# Patient Record
Sex: Male | Born: 1995 | Race: Black or African American | Hispanic: No | Marital: Single | Smoking: Never smoker
Health system: Southern US, Community
[De-identification: ages and names within clinical notes are randomized; demographics above are authoritative.]

## PROBLEM LIST (undated history)

## (undated) DIAGNOSIS — J45909 Unspecified asthma, uncomplicated: Secondary | ICD-10-CM

## (undated) DIAGNOSIS — L309 Dermatitis, unspecified: Secondary | ICD-10-CM

## (undated) HISTORY — DX: Dermatitis, unspecified: L30.9

## (undated) HISTORY — PX: GALLBLADDER SURGERY: SHX652

## (undated) HISTORY — PX: TYMPANOSTOMY TUBE PLACEMENT: SHX32

## (undated) HISTORY — DX: Unspecified asthma, uncomplicated: J45.909

---

## 2019-01-12 ENCOUNTER — Other Ambulatory Visit: Payer: Self-pay | Admitting: Gastroenterology

## 2019-01-12 DIAGNOSIS — R1013 Epigastric pain: Secondary | ICD-10-CM

## 2019-01-13 ENCOUNTER — Encounter: Payer: Self-pay | Admitting: Pediatrics

## 2019-01-13 ENCOUNTER — Ambulatory Visit: Payer: Federal, State, Local not specified - PPO | Admitting: Pediatrics

## 2019-01-13 VITALS — BP 120/72 | HR 84 | Temp 98.3°F | Resp 18 | Ht 70.5 in | Wt 172.6 lb

## 2019-01-13 DIAGNOSIS — T7800XD Anaphylactic reaction due to unspecified food, subsequent encounter: Secondary | ICD-10-CM

## 2019-01-13 DIAGNOSIS — J4520 Mild intermittent asthma, uncomplicated: Secondary | ICD-10-CM | POA: Diagnosis not present

## 2019-01-13 DIAGNOSIS — T7800XA Anaphylactic reaction due to unspecified food, initial encounter: Secondary | ICD-10-CM | POA: Insufficient documentation

## 2019-01-13 DIAGNOSIS — J301 Allergic rhinitis due to pollen: Secondary | ICD-10-CM

## 2019-01-13 DIAGNOSIS — K200 Eosinophilic esophagitis: Secondary | ICD-10-CM

## 2019-01-13 DIAGNOSIS — T781XXD Other adverse food reactions, not elsewhere classified, subsequent encounter: Secondary | ICD-10-CM

## 2019-01-13 DIAGNOSIS — J4521 Mild intermittent asthma with (acute) exacerbation: Secondary | ICD-10-CM | POA: Insufficient documentation

## 2019-01-13 DIAGNOSIS — T781XXA Other adverse food reactions, not elsewhere classified, initial encounter: Secondary | ICD-10-CM | POA: Insufficient documentation

## 2019-01-13 DIAGNOSIS — J339 Nasal polyp, unspecified: Secondary | ICD-10-CM

## 2019-01-13 MED ORDER — ALBUTEROL SULFATE HFA 108 (90 BASE) MCG/ACT IN AERS: INHALATION_SPRAY | RESPIRATORY_TRACT | 1 refills | Status: DC

## 2019-01-13 MED ORDER — FLUTICASONE PROPIONATE 50 MCG/ACT NA SUSP: NASAL | 5 refills | Status: DC

## 2019-01-13 NOTE — Patient Instructions (Addendum)
Environmental control of dust mite and mold Claritin 10 mg-take 1 tablet once a day if needed for runny nose for itchy eyes Fluticasone 1 spray per nostril twice a day for stuffy nose.  It will help nasal polyps Pro-air 2 puffs every 4 hours if needed for wheezing or coughing spells.  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  Budesonide syrup  was stopped in December 2019.  He has seen a gastroenterologist in the area and may need a follow-up biopsy because of abdominal discomfort.  He will continue pantoprazole 40 mg once a day and he will see if 5 days of prednisone helps the abdominal discomfort

## 2019-01-13 NOTE — Progress Notes (Signed)
[address] [place] [address] Dept: [phone]  New Patient Note  Patient ID: Kelly Patrick, male    DOB: 03-28-1996  Age: 23 y.o. MRN: [id number] Date of Office Visit: 01/13/2019 Referring provider: No referring provider defined for this encounter.    Chief Complaint: Asthma  HPI Kelly Patrick presents for allergy evaluation.  He has a history of eosinophilic esophagitis and for 2 years he was on budesonide syrup. In   December 2019 in [place] he had an esophageal biopsy and there were no eosinophils so budesonide syrup was stopped.  Recently he has had some abdominal discomfort and heartburn and was started on pantoprazole.  He has a history of nasal polyps.  Zyrtec gives him nosebleeds.  He has had hypertension from Sudafed.  He has had asthma since 23 years of age but his asthma is much improved.  He has a history of eczema when he was younger.  He has had allergic symptoms to exposure to dust, cigarette smoke, cats and weather changes.  In 2017 he had allergy skin test which showed some reactivity to shellfish, peanut, corn, potato, banana crab, almond and hazelnut.  He has been avoiding these foods He had been able to eat these foods in the past without any problems .  He has itching of his mouth when he eats tomato and fresh apples  Review of Systems  Constitutional: Negative.   HENT:       Seasonal allergic rhinitis for several years History of nasal polyps  Eyes: Negative.   Respiratory:       Asthma since 23 years of age.  No regular need for albuterol  Cardiovascular: Negative.   Gastrointestinal:       History of eosinophilic esophagitis.  Cholecystectomy  Genitourinary: Negative.   Musculoskeletal: Negative.   Skin:       History of eczema  Neurological: Negative.   Endo/Heme/Allergies:       No diabetes or thyroid disease.  Sickle cell trait  Psychiatric/Behavioral: Negative.     Outpatient Encounter Medications as of 01/13/2019  Medication Sig  .  EPINEPHrine 0.3 mg/0.3 mL IJ SOAJ injection Inject 0.3 mg into the muscle once.  . pantoprazole (PROTONIX) 40 MG tablet Take 40 mg by mouth daily.  Marland Kitchen albuterol (PROAIR HFA) 108 (90 Base) MCG/ACT inhaler 2 puffs every 4 hours if needed for wheezing or coughing spells  . fluticasone (FLONASE) 50 MCG/ACT nasal spray 1 spray per nostril twice a day for stuffy nose  . levalbuterol (XOPENEX HFA) 45 MCG/ACT inhaler Inhale 2 puffs into the lungs every 4 (four) hours as needed.   No facility-administered encounter medications on file as of 01/13/2019.      Drug Allergies:  Allergies  Allergen Reactions  . Sudafed  [Pseudoephedrine Hcl] Other (See Comments)    High blood pressure     Family History: Edgard's family history includes Allergic rhinitis in his mother..  Family history is positive for asthma and sinus problems.  Family history is negative for angioedema, eczema, chronic urticaria, food allergies, lupus, chronic bronchitis or emphysema.  Social and environmental.  There are no pets in the home.  He is not exposed to cigarette smoking.  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  He has not smoked cigarettes in the past.  Physical Exam: BP 120/72   Pulse 84   Temp 98.3 F (36.8 C) (Oral)   Resp 18   Ht 5' 10.5" (1.791 m)   Wt 172  lb 9.6 oz (78.3 kg)   SpO2 96%   BMI 24.42 kg/m    Physical Exam Vitals signs reviewed.  Constitutional:      Appearance: Normal appearance. He is normal weight.  HENT:     Head:     Comments: Eyes normal Ears normal.  Nose showed mild swelling of the nasal turbinates with some polypoid changes Pharynx normal. Neck:     Musculoskeletal: Neck supple.     Comments: No thyromegaly Cardiovascular:     Comments: S1-S2 normal no murmurs Pulmonary:     Comments: Clear to percussion and auscultation Abdominal:     Palpations: Abdomen is soft.     Tenderness: There is no abdominal tenderness.     Comments: No hepatosplenomegaly    Lymphadenopathy:     Cervical: No cervical adenopathy.  Skin:    Comments: Clear  Neurological:     General: No focal deficit present.     Mental Status: He is alert and oriented to person, place, and time.  Psychiatric:        Mood and Affect: Mood normal.        Behavior: Behavior normal.        Thought Content: Thought content normal.        Judgment: Judgment normal.     Diagnostics: FVC 4.60 L FEV1 3.78 L.  Predicted FVC 4.88 L predicted FEV1 4.15 L.  After albuterol 2 puffs DC 4.66 L FEV1 4.11 L-the spirometry is in the normal range and there was no significant improvement after albuterol  Allergy skin test were extremely positive to grass pollen, weeds, tree pollens, molds.  Slight reactivity noted to cat, dog, cockroach and dust mite.  He had a 2 x 2 wheal to peanut.  He had a minimal reactivity to almond and slight reactivity to hazelnut.  Skin test to shellfish, corn, banana, tomato and white potatoes were negative   Assessment  Assessment and Plan: 1. Mild intermittent asthma without complication   2. Anaphylactic shock due to food, subsequent encounter   3. EE (eosinophilic esophagitis)   4. Seasonal allergic rhinitis due to pollen   5. Nasal polyposis   6. Pollen-food allergy, subsequent encounter     Meds ordered this encounter  Medications  . albuterol (PROAIR HFA) 108 (90 Base) MCG/ACT inhaler    Sig: 2 puffs every 4 hours if needed for wheezing or coughing spells    Dispense:  1 Inhaler    Refill:  1  . fluticasone (FLONASE) 50 MCG/ACT nasal spray    Sig: 1 spray per nostril twice a day for stuffy nose    Dispense:  18.2 g    Refill:  5    Patient Instructions  Environmental control of dust mite and mold Claritin 10 mg-take 1 tablet once a day if needed for runny nose for itchy eyes Fluticasone 1 spray per nostril twice a day for stuffy nose.  It will help nasal polyps Pro-air 2 puffs every 4 hours if needed for wheezing or coughing spells.  You may  use Pro-air 2 puffs 5 to 15 minutes before exercise Add prednisone 20 mg twice a day for 3 days, 20 mg on the fourth day, 10 mg in the fifth day to bring your allergic symptoms under control Continue on your other medications Call us if you are not doing well on this treatment plan  I will call you with the results of your blood work for allergies to peanut, tree nuts , and  shellfish Avoid them for now and  be very careful with tomato and apples which are related to your oral allergy syndrome I gave you a list of foods associated with oral allergy syndrome from birch and  grass pollen  He has a history of eosinophilic esophagitis.  Budesonide syrup  was stopped in December 2019.  He has seen a gastroenterologist in the area and may need a follow-up biopsy because of abdominal discomfort.  He will continue pantoprazole 40 mg once a day and he will see if 5 days of prednisone helps the abdominal discomfort   Return in about 4 weeks (around 02/10/2019).   Thank you for the opportunity to care for this patient.  Please do not hesitate to contact me with questions.  Tonette Bihari, M.D.  [facility] [address] [place], [place] [address] [phone]

## 2019-01-14 ENCOUNTER — Ambulatory Visit
Admission: RE | Admit: 2019-01-14 | Discharge: 2019-01-14 | Disposition: A | Discharge status: Home / Self Care | Payer: Federal, State, Local not specified - PPO | Source: Ambulatory Visit | Attending: Gastroenterology | Admitting: Gastroenterology

## 2019-01-14 ENCOUNTER — Other Ambulatory Visit: Payer: Self-pay | Admitting: Gastroenterology

## 2019-01-14 DIAGNOSIS — R1013 Epigastric pain: Secondary | ICD-10-CM

## 2019-01-14 MED ORDER — IOPAMIDOL (ISOVUE-300) INJECTION 61%: 100.0000 mL | Freq: Once | INTRAVENOUS | Status: DC | PRN

## 2019-01-15 ENCOUNTER — Telehealth: Payer: Self-pay | Admitting: *Deleted

## 2019-01-15 DIAGNOSIS — T7800XD Anaphylactic reaction due to unspecified food, subsequent encounter: Secondary | ICD-10-CM

## 2019-01-15 NOTE — Telephone Encounter (Signed)
ADDED lab orders and informed mom of me doing so

## 2019-01-15 NOTE — Telephone Encounter (Signed)
Go ahead and add  those foods

## 2019-01-15 NOTE — Telephone Encounter (Signed)
I informed mom yes we did test for white potato and it was negative. But we did not test to peas, strawberries, or oranges. Can we add those to his lab orders?

## 2019-01-15 NOTE — Telephone Encounter (Signed)
Pt mother called stating that Kelly Patrick had an allergy test done on March 3rd. She has the results from the test but said that there are several things that Kelly Patrick has previously tested positive to that are not included on the list. Potatoes, Oranges, Strawberry's, and peas. Mom is wondering if we tested for those foods.  Kelly Patrick is supposed to get blood work done and mom is wanting to know if we can please add those four foods to the lab orders.  Requesting a return call.

## 2019-01-23 LAB — IGE PEANUT COMPONENT PROFILE
F352-IgE Ara h 8: 8.89 kU/L — AB
F422-IgE Ara h 1: <0.1 kU/L
F423-IgE Ara h 2: <0.1 kU/L
F424-IgE Ara h 3: <0.1 kU/L
F427-IgE Ara h 9: <0.1 kU/L
F447-IgE Ara h 6: <0.1 kU/L

## 2019-01-23 LAB — ALLERGEN PROFILE, SHELLFISH
Clam IgE: 0.78 kU/L — AB
F023-IgE Crab: 0.76 kU/L — AB
F080-IgE Lobster: 0.46 kU/L — AB
F290-IgE Oyster: <0.1 kU/L
Scallop IgE: 0.42 kU/L — AB
Shrimp IgE: 1.17 kU/L — AB

## 2019-01-23 LAB — ALLERGEN PISTACHIO F203: F203-IgE Pistachio Nut: 0.93 kU/L — AB

## 2019-01-23 LAB — ALLERGENS(7)
Brazil Nut IgE: <0.1 kU/L
F020-IgE Almond: 1.28 kU/L — AB
F202-IgE Cashew Nut: <0.1 kU/L
Hazelnut (Filbert) IgE: 32.3 kU/L — AB
Peanut IgE: 3.99 kU/L — AB
Pecan Nut IgE: <0.1 kU/L
Walnut IgE: 0.3 kU/L — AB

## 2019-01-23 LAB — ALLERGEN COCONUT IGE: Allergen Coconut IgE: 0.38 kU/L — AB

## 2019-01-23 LAB — ALLERGEN, STRAWBERRY, F44: Allergen Strawberry IgE: 1.08 kU/L — AB

## 2019-01-23 LAB — ALLERGEN, ORANGE F33: Orange: 0.44 kU/L — AB

## 2019-01-23 LAB — ALLERGEN PEA F12: Allergen Green Pea IgE: 0.17 kU/L — AB

## 2019-01-28 ENCOUNTER — Telehealth: Payer: Self-pay | Admitting: Pediatrics

## 2019-01-28 ENCOUNTER — Other Ambulatory Visit: Payer: Self-pay

## 2019-01-28 MED ORDER — ALBUTEROL SULFATE (2.5 MG/3ML) 0.083% IN NEBU: 2.5000 mg | INHALATION_SOLUTION | Freq: Four times a day (QID) | RESPIRATORY_TRACT | 5 refills | Status: DC | PRN

## 2019-01-28 NOTE — Telephone Encounter (Signed)
Nebulizer solution sent to [facility].

## 2019-01-28 NOTE — Telephone Encounter (Signed)
Pt mom called and needs to have albuterol for neb. [facility] [place]. [phone].

## 2019-02-02 ENCOUNTER — Telehealth: Payer: Self-pay

## 2019-02-02 NOTE — Telephone Encounter (Signed)
Patient mother called upset about her son results have not been release to them and wanted to know why no one has called about lab results. Advise patient's mother that Dr. Beaulah Dinning has been out of the office and has not reviewed his labs. Patient doesn't have his mother or father on the [place] and only have a Dr. Vear Clock. Patient # is [id number]

## 2019-02-06 ENCOUNTER — Telehealth: Payer: Self-pay

## 2019-02-06 NOTE — Telephone Encounter (Signed)
Please call mom back.  I reviewed results - peanuts, tree nuts, coconut, shellfish were all positive. I recommend strict avoidance of these foods.  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 Usually fresh tree fruits such as apples can cause this. This is caused by cross reactivity of pollen with fresh fruits and vegetables, and nuts. Symptoms are usually localized in the form of itching and burning in mouth and throat. Very rarely it can progress to more severe symptoms. Eating foods in cooked or processed forms usually minimizes symptoms. I recommended avoidance of eating the problem foods, especially during the peak season(s).   Did the prednisone help with his GI symptoms? It looks like he has history of eosinophilic esophagitis which is why he may have the GI symptoms he complained about at the last visit.   Can you clarify on what she means by he gets sick everytime he eats? Thank you.

## 2019-02-06 NOTE — Telephone Encounter (Signed)
Please see phone encounter note from 02/06/2019.

## 2019-02-06 NOTE — Telephone Encounter (Signed)
pts mom calling to get lab results Kelly Patrick talked with mom Kelly Patrick after I got verbal consent to talk to mom from pt. Dr Selena Batten has agreed to look at labs and we will call pts mom back within an hour. Please advise

## 2019-02-06 NOTE — Telephone Encounter (Signed)
Dr. Selena Batten the patient's mother called very upset that she has not yet to receive the results of the patient's lab results from Dr. Beaulah Dinning. Advised that Dr. Beaulah Dinning has been out. Consulted with Dr. Delorse Lek and she recommends to have the provider in [facility] to review the labs so that they can be relayed to the patient's family. Will you please review the labs and send your results to the [facility] pool so that they are able to contact the patient. Thank You.

## 2019-02-06 NOTE — Telephone Encounter (Addendum)
SPENT  25 minutes on phone with both parents explaining everything to them. Dad is a Engineer, civil (consulting) and is very concerned about what pt can eat. I gave pts mom my email address here at work to send me results of testing he had done at [facility] march 17 th of this year. They are all confused on what exactly he can eat. I told them I would forward questions and testing to dr Beaulah Dinning on Monday when he returns and we will do our best to figure out what pt can eat. Pt did stat he is just having some cramping when he eats foods he is allergic too.   I informed them I would call back Monday and give them more information once dr B is back in clinic and reviews everything.  Moms # [phone]

## 2019-02-10 ENCOUNTER — Other Ambulatory Visit: Payer: Self-pay

## 2019-02-10 ENCOUNTER — Ambulatory Visit (INDEPENDENT_AMBULATORY_CARE_PROVIDER_SITE_OTHER): Payer: Federal, State, Local not specified - PPO | Admitting: Allergy

## 2019-02-10 ENCOUNTER — Encounter: Payer: Self-pay | Admitting: Allergy

## 2019-02-10 DIAGNOSIS — J301 Allergic rhinitis due to pollen: Secondary | ICD-10-CM

## 2019-02-10 DIAGNOSIS — J4520 Mild intermittent asthma, uncomplicated: Secondary | ICD-10-CM

## 2019-02-10 DIAGNOSIS — T781XXD Other adverse food reactions, not elsewhere classified, subsequent encounter: Secondary | ICD-10-CM | POA: Diagnosis not present

## 2019-02-10 DIAGNOSIS — K200 Eosinophilic esophagitis: Secondary | ICD-10-CM

## 2019-02-10 NOTE — Progress Notes (Signed)
RE: Salaam Ceresa MRN: [id number] DOB: 04-28-1996 Date of Telemedicine Visit: 02/10/2019  Referring provider: No ref. provider found Primary care provider: Patient, No Pcp Per  Chief Complaint: Food Intolerance   Telemedicine Follow Up Visit via WebEx: I connected with Benjamyn Deorio for a follow up on 02/12/19 by WebEx and verified that I am speaking with the correct person using two identifiers.   I discussed the limitations, risks, security and privacy concerns of performing an evaluation and management service by telemedicine and the availability of in person appointments. I also discussed with the patient that there may be a patient responsible charge related to this service. The patient expressed understanding and agreed to proceed.  Patient is at home accompanied by mother and father who is at work. Both parents provided/contributed to the history.  Provider is at the office.  Visit start time: 3:31PM Visit end time: 4:05PM Insurance consent/check in by: Rosalita Levan Medical consent and medical assistant/nurse: Rosana Fret  History of Present Illness: He is a 23 y.o. male, who is being followed for asthma, allergic rhinitis, nasal polyp, eoe, adverse food reactions. His previous allergy office visit was on 01/13/2019 with Dr. Beaulah Dinning. Today concerned about skin testing and bloodwork results for the foods and looking for direction.  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 Patient planning on coming back to [facility] in June.   Patient was diagnosed with EoE a few years ago and was on Budesonide BID with good benefit. He had EGD in Dec 2019 which showed no eosinophils. Budesonide was stopped and afterwards noticing abdominal pain. Denies any nausea, vomiting or trouble swallowing liquids/foods.  They want to know what he can have to eat as he had various skin testing and bloodwork and he is getting mixed instructions.   He is being followed by ENT and GI as  well.    Currently avoiding rice, peanut, soy, beef, shrimp, lobster, orange, corn, potato, tomatoes, tree nuts, strawberries.  In 2017 he had allergy skin test which showed some reactivity to shellfish, peanut, corn, potato, banana crab, almond and hazelnut.  He has itching of his mouth when he eats tomato and fresh apples.  Patient also has allergic rhinitis, asthma and ? nasal polyps.      EGD Dec 2019 pathology: 1. The sections show portions of gastric fundic and antral mucosa with irregularly shaped pits and glands. The surface epithelium shows inflammatory changes with decreased mucin production and enlarged nuclei. The lamina propria shows no significant inflammation, but has mild edema and smooth muscle proliferation. No bacteria resembling H. pylori are seen. There is no evidence of goblet cell metaplasia, ulceration, or malignancy.  2. This is mildly congested squamous epithelium. The specimen contains stratified squamous epithelium. The epithelium is free of inflammation. There are no eosinophilic infiltrates. The basal cell layer is not thickened. There is no evidence of ulcer or erosion.  Assessment and Plan: Carrson is a 23 y.o. male with: EE (eosinophilic esophagitis) Patient diagnosed with eosinophilic esophagitis a few years ago and was well controlled with budesonide BID. Last December 2019 he had repeat EGD which was clear and stopped budesonide. Since then he has been having abdominal pains but denies any other GI symptoms. Parents very concerned about what foods he can eat as he has been getting different instructions. He is being followed by GI and ENT as well. He had multiple sets of food/immunocap testing. Please see chart and notes for details.  Had  a long discussion with mother, father and patient today via Webex and educated them about eoe - diagnosis and management.   I did email them a pdf handout on EoE which gives information on EoE management and  diagnosis.  Keep a food diary and diary of symptoms to try to identify the foods that are triggering your symptoms.  For now, I want you to start with a strict dairy elimination diet. You should do this for at least 8 weeks. Ideally, you should get a repeat EGD after each food elimination diet is done as sometimes clinical symptoms do not correlate with pathology.  FOODS TO AVOID: - All cows, goats and sheeps milk (whole, low-fat, skim, butter milk, evaporated, condensed, powdered, formula milk, hot cocoa) - Milk products (all kind of cheeses, yogurt, butter, margarine, ice creams, milkshakes, custard, creme caramel, rice pudding) - Foods that may contain milk (biscuits, cookies, donuts, muffins, pancakes, waffles, crackers, cream desserts, sweets, candies, chocolate with milk, walnut cream, sausages, ham, pork sausage) FOOD REINTRODUCTION: - May reintroduce beef, peas, processed strawberry, processed orange. (Processed means not fresh forms). One at a time over 1 week period.  - Please continue strict avoidance of peanuts, tree nuts, shellfish, soy, potato, tomato, rice and corn.  - I do not want to make too many changes to your diet as you may have more than 1 trigger foods.  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  Discussed that his food triggered oral and throat symptoms are likely caused by oral food allergy syndrome (OFAS). This is caused by cross reactivity of pollen with fresh fruits and vegetables, and nuts. Symptoms are usually localized in the form of itching and burning in mouth and throat. Very rarely it can progress to more severe symptoms. Eating foods in cooked or processed forms usually minimizes symptoms. I recommended avoidance of eating the problem foods, especially during the peak season(s). Sometimes, OFAS can induce severe throat swelling or even a systemic reaction; with such instance, I advised them to report to a local ER. A list of  common pollens and food cross-reactivities was provided to the patient.   Seasonal allergic rhinitis due to pollen Past history - March 2020 skin testing was positive to grass, weed, tree, mold, cat, dog, cockroach, dust mite. 2020 Immunocap was positive additionally to ragweed. Interim history   Continue environmental control measures.  May use over the counter antihistamines such as Zyrtec (cetirizine), Claritin (loratadine), Allegra (fexofenadine), or Xyzal (levocetirizine) daily as needed.  May use Flonase 1-2 sprays daily for nasal congestion.  Mild intermittent asthma without complication  Daily controller medication(s): NONE  Prior to physical activity: May use albuterol rescue inhaler 2 puffs 5 to 15 minutes prior to strenuous physical activities.  Rescue medications: May use albuterol rescue inhaler 2 puffs or nebulizer every 4 to 6 hours as needed for shortness of breath, chest tightness, coughing, and wheezing. Monitor frequency of use.   Return in about 2 months (around 04/12/2019).  Diagnostics: None.  Medication List:  Current Outpatient Medications  Medication Sig Dispense Refill   albuterol (PROAIR HFA) 108 (90 Base) MCG/ACT inhaler 2 puffs every 4 hours if needed for wheezing or coughing spells 1 Inhaler 1   albuterol (PROVENTIL) (2.5 MG/3ML) 0.083% nebulizer solution Take 3 mLs (2.5 mg total) by nebulization every 6 (six) hours as needed for wheezing or shortness of breath. 75 mL 5   EPINEPHrine 0.3 mg/0.3 mL IJ SOAJ injection Inject 0.3 mg into the muscle  once.     fluticasone (FLONASE) 50 MCG/ACT nasal spray 1 spray per nostril twice a day for stuffy nose 18.2 g 5   pantoprazole (PROTONIX) 40 MG tablet Take 40 mg by mouth daily.     No current facility-administered medications for this visit.    Allergies: Allergies  Allergen Reactions   Sudafed  [Pseudoephedrine Hcl] Other (See Comments)    High blood pressure    I reviewed his past medical  history, social history, family history, and environmental history and no significant changes have been reported from previous visit on 01/13/2019.  Review of Systems  Constitutional: Negative for appetite change, chills, fever and unexpected weight change.  HENT: Negative for congestion and rhinorrhea.   Eyes: Negative for itching.  Respiratory: Negative for cough, chest tightness, shortness of breath and wheezing.   Gastrointestinal: Positive for abdominal pain. Negative for nausea and vomiting.  Skin: Negative for rash.  Allergic/Immunologic: Positive for environmental allergies and food allergies.  Neurological: Negative for headaches.   Objective: Physical Exam Not obtained as encounter was done via WebEx.  Previous notes and tests were reviewed.  I discussed the assessment and treatment plan with the patient. The patient was provided an opportunity to ask questions and all were answered. The patient agreed with the plan and demonstrated an understanding of the instructions. After visit summary/patient instructions available via e-mail.   The patient was advised to call back or seek an in-person evaluation if the symptoms worsen or if the condition fails to improve as anticipated.  I provided 34 minutes of video-face-to-face time during this encounter.  It was my pleasure to participate in Pajaros Tokarski's care today. Please feel free to contact me with any questions or concerns.   Sincerely,  Wyline Mood, DO Allergy & Immunology  [facility] office: [phone] [facility] office: [phone]

## 2019-02-11 ENCOUNTER — Encounter: Payer: Self-pay | Admitting: Allergy

## 2019-02-11 NOTE — Patient Instructions (Addendum)
Please read the article attached starting with page 9 and page 10.  As we discussed it's important to keep a food diary and diary of symptoms to try to identify the foods that are triggering your symptoms.  For now, I want you to start with a strict dairy elimination diet. You should do this for at least 8 weeks. Ideally, you should get a repeat EGD after each food elimination diet is done as sometimes clinical symptoms do not correlate with pathology.   FOODS TO AVOID: - All cows, goats and sheeps milk (whole, low-fat, skim, butter milk, evaporated, condensed, powdered, formula milk, hot cocoa) - Milk products (all kind of cheeses, yogurt, butter, margarine, ice creams, milkshakes, custard, creme caramel, rice pudding) - Foods that may contain milk (biscuits, cookies, donuts, muffins, pancakes, waffles, crackers, cream desserts, sweets, candies, chocolate with milk, walnut cream, sausages, ham, pork sausage)  FOOD REINTRODUCTION: May reintroduce beef, peas, processed strawberry, processed orange. (Processed means not fresh forms). One at a time over 1 week period.   Please continue strict avoidance of peanuts, tree nuts, shellfish, soy, potato, tomato, rice and corn.  I do not want to make too many changes to your diet as you may have more than 1 trigger foods.  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 Discussed that his food triggered oral and throat symptoms are likely caused by oral food allergy syndrome (OFAS). This is caused by cross reactivity of pollen with fresh fruits and vegetables, and nuts. Symptoms are usually localized in the form of itching and burning in mouth and throat. Very rarely it can progress to more severe symptoms. Eating foods in cooked or processed forms usually minimizes symptoms. I recommended avoidance of eating the problem foods, especially during the peak  season(s). Sometimes, OFAS can induce severe throat swelling or even a systemic reaction; with such instance, I advised them to report to a local ER.    You are allergic to birch tree pollen, ragweed, mugwort, orchard and timothy and may notice issues with the below foods (see chart)  Environmental allergies: Past history - March 2020 skin testing was positive to grass, weed, tree, mold, cat, dog, cockroach, dust mite. 2020 Immunocap was positive additionally to ragweed. Interim history   Continue environmental control measures.  May use over the counter antihistamines such as Zyrtec (cetirizine), Claritin (loratadine), Allegra (fexofenadine), or Xyzal (levocetirizine) daily as needed.  May use Flonase 1-2 sprays daily for nasal congestion.  Asthma: . Daily controller medication(s): NONE . Prior to physical activity: May use albuterol rescue inhaler 2 puffs 5 to 15 minutes prior to strenuous physical activities. Marland Kitchen Rescue medications: May use albuterol rescue inhaler 2 puffs or nebulizer every 4 to 6 hours as needed for shortness of breath, chest tightness, coughing, and wheezing. Monitor frequency of use.      Sincerely,  Wyline Mood, DO Allergy & Immunology  [facility] office: [phone] [facility] office: [phone]

## 2019-02-12 NOTE — Assessment & Plan Note (Addendum)
Patient diagnosed with eosinophilic esophagitis a few years ago and was well controlled with budesonide BID. Last December 2019 he had repeat EGD which was clear and stopped budesonide. Since then he has been having abdominal pains but denies any other GI symptoms. Parents very concerned about what foods he can eat as he has been getting different instructions. He is being followed by GI and ENT as well. He had multiple sets of food/immunocap testing. Please see chart and notes for details.  Had a long discussion with mother, father and patient today via Web[employer] and educated them about eoe - diagnosis and management.   I did email them a pdf handout on EoE which gives information on EoE management and diagnosis.  Keep a food diary and diary of symptoms to try to identify the foods that are triggering your symptoms.  For now, I want you to start with a strict dairy elimination diet. You should do this for at least 8 weeks. Ideally, you should get a repeat EGD after each food elimination diet is done as sometimes clinical symptoms do not correlate with pathology.  FOODS TO AVOID: - All cows, goats and sheeps milk (whole, low-fat, skim, butter milk, evaporated, condensed, powdered, formula milk, hot cocoa) - Milk products (all kind of cheeses, yogurt, butter, margarine, ice creams, milkshakes, custard, creme caramel, rice pudding) - Foods that may contain milk (biscuits, cookies, donuts, muffins, pancakes, waffles, crackers, cream desserts, sweets, candies, chocolate with milk, walnut cream, sausages, ham, pork sausage) FOOD REINTRODUCTION: - May reintroduce beef, peas, processed strawberry, processed orange. (Processed means not fresh forms). One at a time over 1 week period.  - Please continue strict avoidance of peanuts, tree nuts, shellfish, soy, potato, tomato, rice and corn.  - I do not want to make too many changes to your diet as you may have more than 1 trigger foods.

## 2019-02-12 NOTE — Assessment & Plan Note (Signed)
Past history - March 2020 skin testing was positive to grass, weed, tree, mold, cat, dog, cockroach, dust mite. 2020 Immunocap was positive additionally to ragweed. Interim history   Continue environmental control measures.  May use over the counter antihistamines such as Zyrtec (cetirizine), Claritin (loratadine), Allegra (fexofenadine), or Xyzal (levocetirizine) daily as needed.  May use Flonase 1-2 sprays daily for nasal congestion.

## 2019-02-12 NOTE — Assessment & Plan Note (Signed)
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  Discussed that his food triggered oral and throat symptoms are likely caused by oral food allergy syndrome (OFAS). This is caused by cross reactivity of pollen with fresh fruits and vegetables, and nuts. Symptoms are usually localized in the form of itching and burning in mouth and throat. Very rarely it can progress to more severe symptoms. Eating foods in cooked or processed forms usually minimizes symptoms. I recommended avoidance of eating the problem foods, especially during the peak season(s). Sometimes, OFAS can induce severe throat swelling or even a systemic reaction; with such instance, I advised them to report to a local ER. A list of common pollens and food cross-reactivities was provided to the patient.

## 2019-02-12 NOTE — Assessment & Plan Note (Signed)
.   Daily controller medication(s): NONE . Prior to physical activity: May use albuterol rescue inhaler 2 puffs 5 to 15 minutes prior to strenuous physical activities. Marland Kitchen Rescue medications: May use albuterol rescue inhaler 2 puffs or nebulizer every 4 to 6 hours as needed for shortness of breath, chest tightness, coughing, and wheezing. Monitor frequency of use.

## 2019-04-02 ENCOUNTER — Telehealth: Payer: Self-pay | Admitting: *Deleted

## 2019-04-02 NOTE — Telephone Encounter (Signed)
Patients mother calling wanting to ask Dr. Beaulah Dinning' option on if he can get a hypoallergenic dog?

## 2019-04-07 NOTE — Telephone Encounter (Signed)
I discussed getting a dog for Keeyon with his mother.  The family wants to get a small poodle.  He only had slight reactivity to dog on  intradermal testing .  He may get a poodle but keep it out of his bedroom

## 2019-06-21 IMAGING — CT CT ABD-PELV W/O CM
2 of 4 series · 13 of 46 positions shown, 15 images · non-contrast
Comparison: None.

CLINICAL DATA: Epigastric abdominal pain, status post
cholecystectomy, eosinophilic esophagitis.

EXAM:
CT ABDOMEN AND PELVIS WITHOUT CONTRAST
TECHNIQUE: Multidetector CT imaging of the abdomen and pelvis was performed
following the standard protocol without IV contrast.

[Series 2: routine abdomen pelvis without 5.00 br40 s3 ax · axial · non-contrast · 0.51mm/px · z∈[+1385,+1764]mm · 10 of 92 slices shown, 12 images]
[im 8/92  soft-tissue]
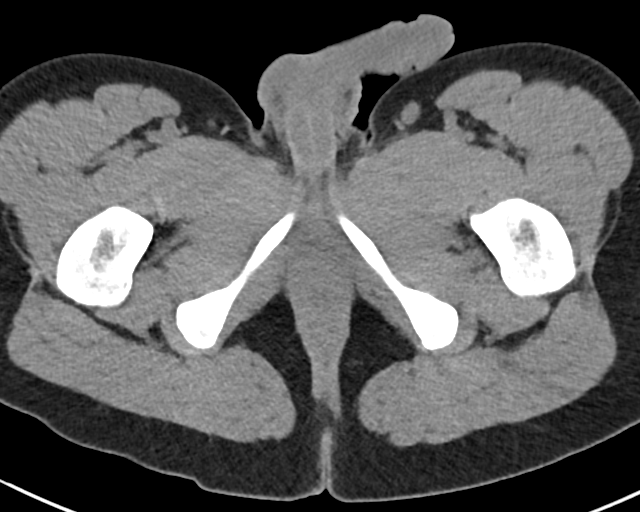
[im 8/92  bone]
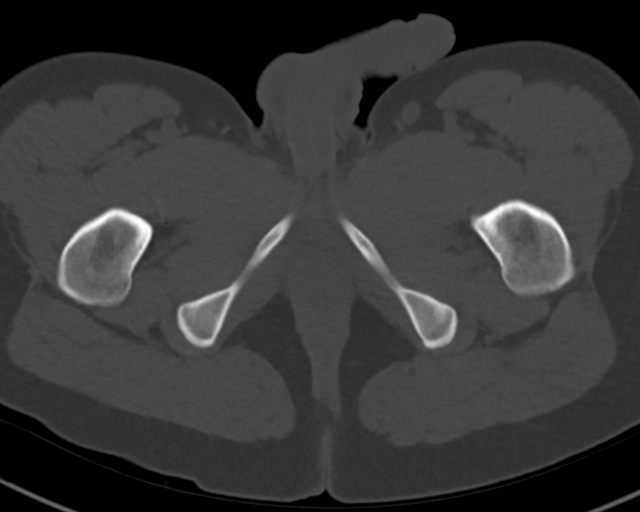
[im 16/92  soft-tissue]
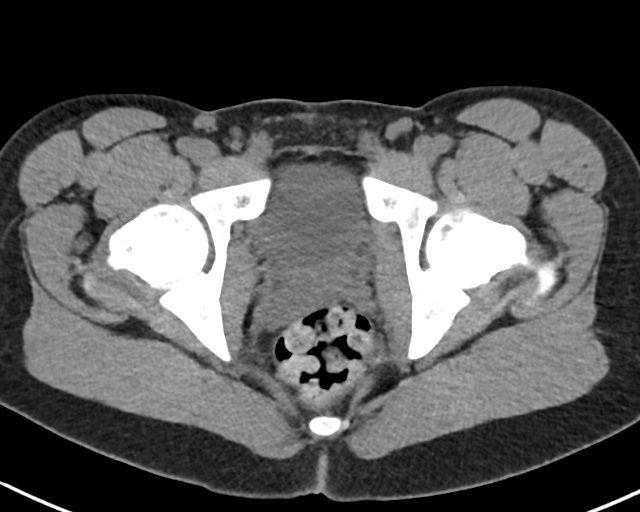
[im 24/92  soft-tissue]
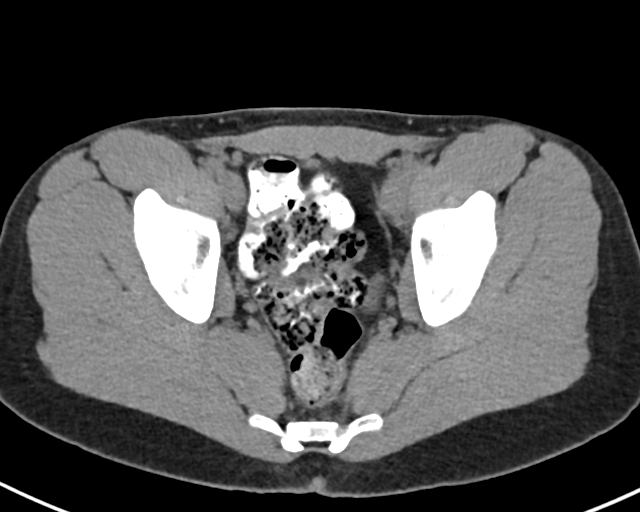
[im 32/92  soft-tissue]
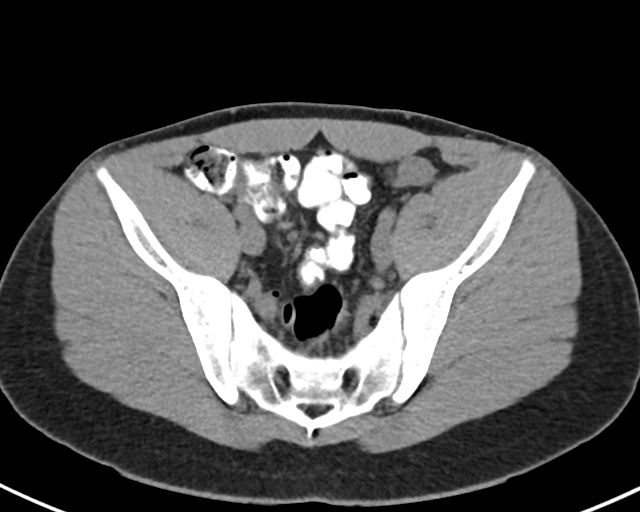
[im 40/92  soft-tissue]
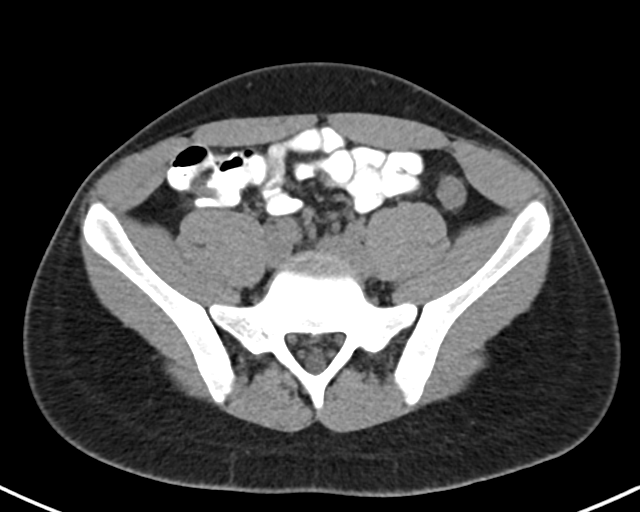
[im 52/92  soft-tissue]
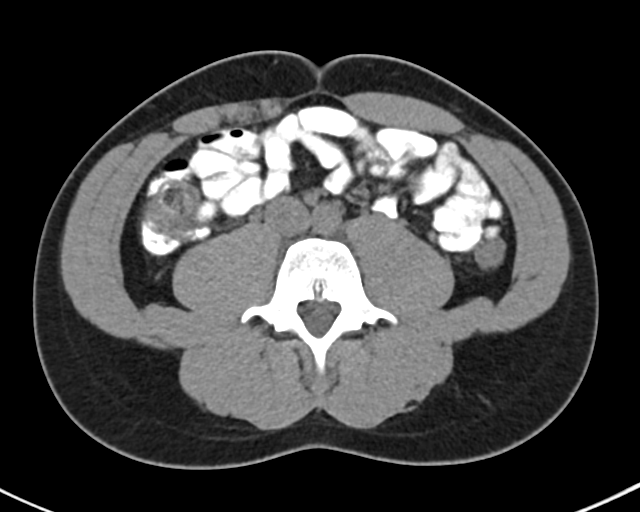
[im 60/92  soft-tissue]
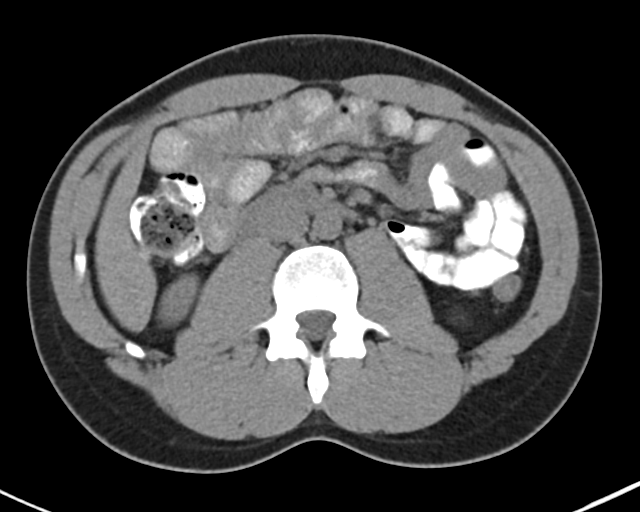
[im 68/92  soft-tissue]
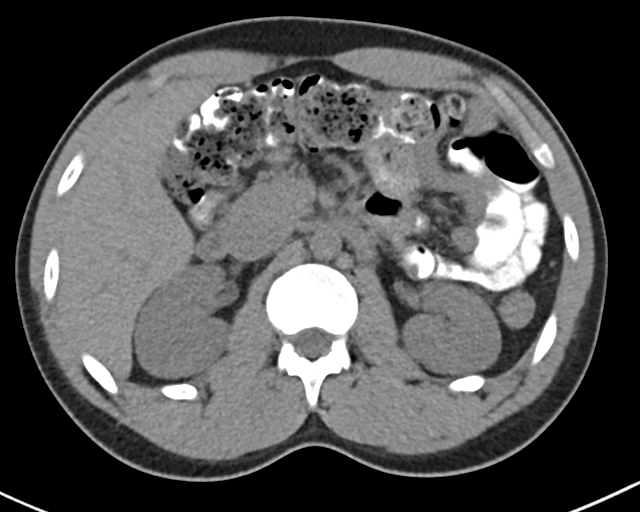
[im 76/92  soft-tissue]
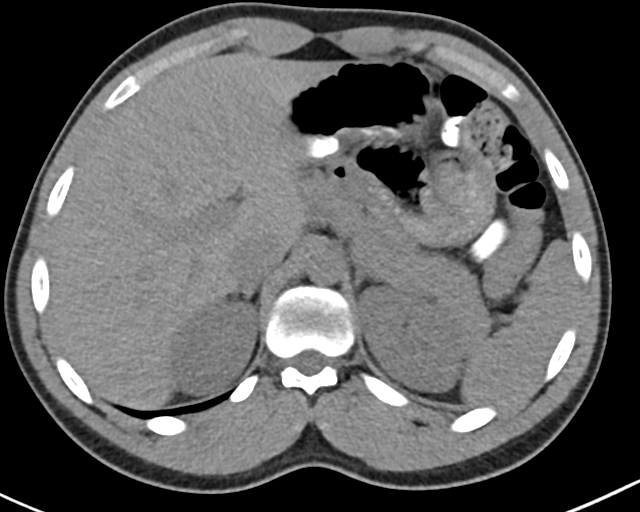
[im 76/92  bone]
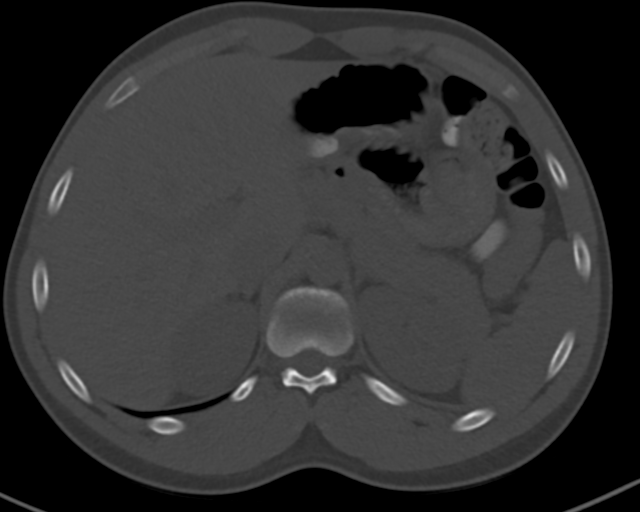
[im 84/92  soft-tissue]
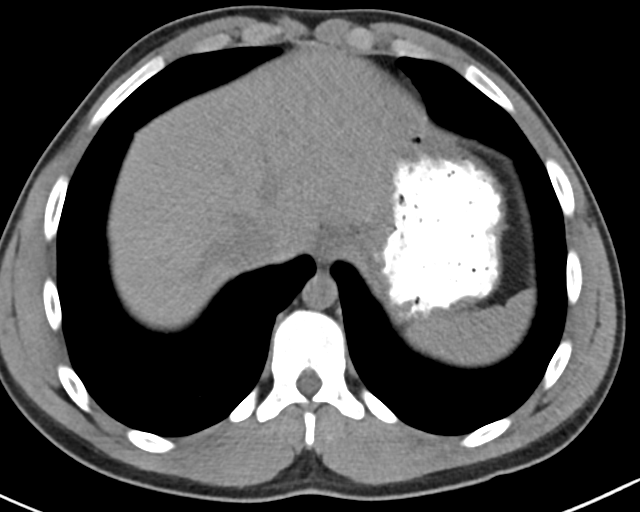

[Series 4: routine abdomen pelvis without 2.00 br40 s3 cor · coronal · non-contrast · 0.63mm/px · 3 of 129 slices shown]
[im 43/129  soft-tissue]
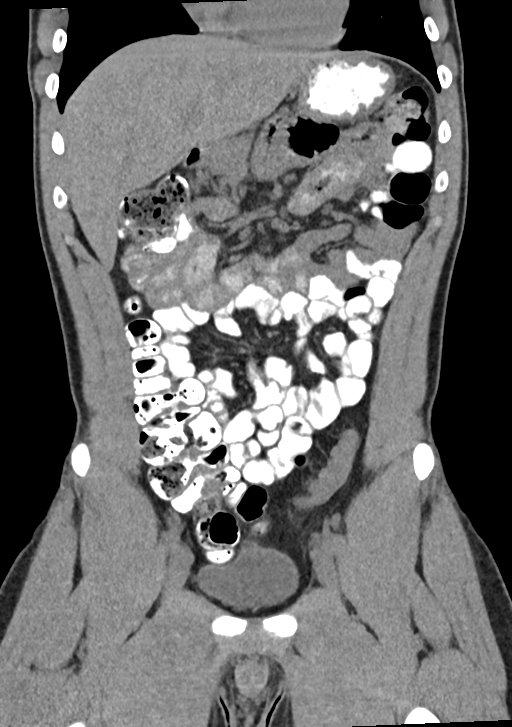
[im 57/129  soft-tissue]
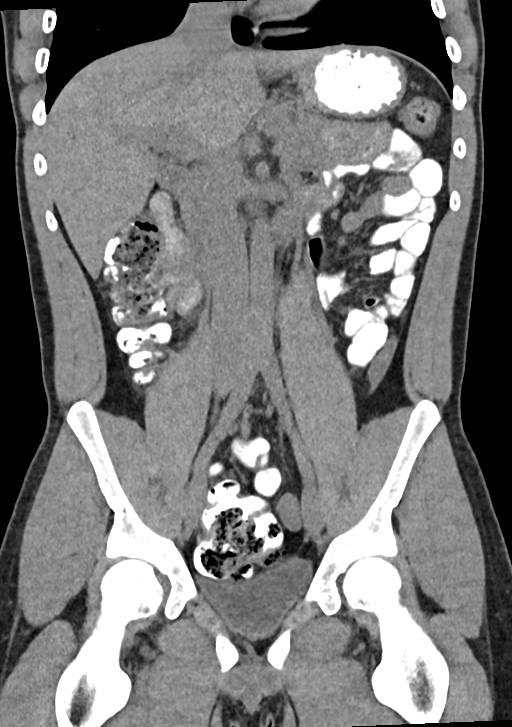
[im 72/129  soft-tissue]
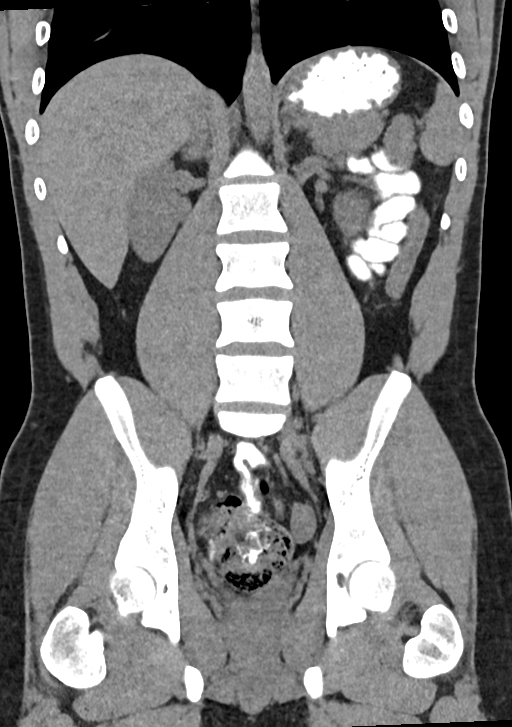

[13 of 46 positions shown; findings below may reference images not displayed]

FINDINGS: Lower chest: Lung bases are clear.

Hepatobiliary: Unenhanced liver is unremarkable.

Status post cholecystectomy. No intrahepatic or extrahepatic ductal
dilatation.

Pancreas: Within normal limits.

Spleen: Within normal limits.

Adrenals/Urinary Tract: Adrenal glands are within normal limits.

Kidneys are within normal limits. No renal, ureteral, or bladder
calculi. No hydronephrosis.

Bladder is within normal limits.

Stomach/Bowel: Stomach is within normal limits.

No evidence of bowel obstruction.

Normal appendix (series 2/image 59).

Vascular/Lymphatic: No evidence of abdominal aortic aneurysm.

No suspicious abdominopelvic lymphadenopathy.

Reproductive: Prostate is unremarkable.

Other: No abdominopelvic ascites.

Musculoskeletal: Visualized osseous structures are within normal
limits.
IMPRESSION: Negative CT abdomen/pelvis.

Status post cholecystectomy.

## 2019-08-18 ENCOUNTER — Emergency Department (HOSPITAL_COMMUNITY)
Admission: EM | Admit: 2019-08-18 | Discharge: 2019-08-18 | Disposition: A | Discharge status: Home / Self Care | Payer: Federal, State, Local not specified - PPO | Attending: Emergency Medicine | Admitting: Emergency Medicine

## 2019-08-18 ENCOUNTER — Other Ambulatory Visit: Payer: Self-pay

## 2019-08-18 DIAGNOSIS — Z20828 Contact with and (suspected) exposure to other viral communicable diseases: Secondary | ICD-10-CM | POA: Insufficient documentation

## 2019-08-18 DIAGNOSIS — Z79899 Other long term (current) drug therapy: Secondary | ICD-10-CM | POA: Diagnosis not present

## 2019-08-18 DIAGNOSIS — R509 Fever, unspecified: Secondary | ICD-10-CM | POA: Diagnosis not present

## 2019-08-18 DIAGNOSIS — J45909 Unspecified asthma, uncomplicated: Secondary | ICD-10-CM | POA: Diagnosis not present

## 2019-08-18 DIAGNOSIS — R040 Epistaxis: Secondary | ICD-10-CM | POA: Insufficient documentation

## 2019-08-18 DIAGNOSIS — J029 Acute pharyngitis, unspecified: Secondary | ICD-10-CM | POA: Diagnosis present

## 2019-08-18 LAB — GROUP A STREP BY PCR: Group A Strep by PCR: NOT DETECTED

## 2019-08-18 MED ORDER — OXYMETAZOLINE HCL 0.05 % NA SOLN
1.0000 | Freq: Once | NASAL | Status: AC
  Administered 2019-08-18: 20:00:00 1 via NASAL
  Filled 2019-08-18: qty 30

## 2019-08-18 NOTE — ED Notes (Signed)
PA notified on right nose bleeding after nasal swab.

## 2019-08-18 NOTE — Discharge Instructions (Signed)
Tylenol for fever.  Return if any problems.  

## 2019-08-18 NOTE — ED Provider Notes (Signed)
[facility] EMERGENCY DEPARTMENT Provider Note   CSN: [id number] Arrival date & time: 08/18/19  1722     History   Chief Complaint Chief Complaint  Patient presents with  . Sore Throat  . Fever    HPI Kelly Patrick is a 23 y.o. male.     The history is provided by the patient and a relative. No language interpreter was used.  Sore Throat This is a new problem. The current episode started 2 days ago. The problem occurs constantly. The problem has been gradually worsening. Pertinent negatives include no headaches and no shortness of breath. Nothing aggravates the symptoms. Nothing relieves the symptoms. He has tried nothing for the symptoms. The treatment provided no relief.  Fever Associated symptoms: no headaches    Pt complains of a sore throat and a fever.  Pt reports he went to a party on Saturday.  Past Medical History:  Diagnosis Date  . Asthma   . Eczema     Patient Active Problem List   Diagnosis Date Noted  . 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 Mild intermittent asthma without complication 01/13/2019  . EE (eosinophilic esophagitis) 01/13/2019  . Seasonal allergic rhinitis due to pollen 01/13/2019  . Nasal polyposis 01/13/2019  . 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 GALLBLADDER SURGERY    . TYMPANOSTOMY TUBE PLACEMENT          Home Medications    Prior to Admission medications   Medication Sig Start Date End Date Taking? Authorizing Provider  albuterol (PROAIR HFA) 108 (90 Base) MCG/ACT inhaler 2 puffs every 4 hours if needed for wheezing or coughing spells 01/13/19   Fletcher Anon, MD  albuterol (PROVENTIL) (2.5 MG/3ML) 0.083% nebulizer solution Take 3 mLs (2.5 mg total) by nebulization every 6 (six) hours as needed for wheezing or shortness of breath. 01/28/19   Fletcher Anon, MD  EPINEPHrine 0.3 mg/0.3 mL IJ SOAJ injection Inject 0.3 mg into the muscle  once. 01/12/19   [provider]  fluticasone (FLONASE) 50 MCG/ACT nasal spray 1 spray per nostril twice a day for stuffy nose 01/13/19   Fletcher Anon, MD  pantoprazole (PROTONIX) 40 MG tablet Take 40 mg by mouth daily. 01/12/19   [provider]    Family History Family History  Problem Relation Age of Onset  . Allergic rhinitis Mother   . Angioedema Neg Hx   . Asthma Neg Hx   . Eczema Neg Hx   . Immunodeficiency Neg Hx   . Urticaria Neg Hx     Social History Social History   Tobacco Use  . Smoking status: Never Smoker  . Smokeless tobacco: Never Used  Substance Use Topics  . Alcohol use: Never    Frequency: Never  . Drug use: Never     Allergies   Sudafed  [pseudoephedrine hcl]   Review of Systems Review of Systems  Constitutional: Positive for fever.  Respiratory: Negative for shortness of breath.   Neurological: Negative for headaches.  All other systems reviewed and are negative.    Physical Exam Updated Vital Signs BP 127/67 (BP Location: Right Arm)   Pulse 87   Temp (!) 100.4 F (38 C) (Oral)   Resp 16   Ht 6' (1.829 m)   Wt 74.8 kg   SpO2 100%   BMI 22.38 kg/m   Physical Exam Vitals signs and nursing note reviewed.  Constitutional:  Appearance: He is well-developed.  HENT:     Head: Normocephalic and atraumatic.     Mouth/Throat:     Mouth: Mucous membranes are moist.     Pharynx: Posterior oropharyngeal erythema present.  Eyes:     Conjunctiva/sclera: Conjunctivae normal.  Neck:     Musculoskeletal: Neck supple.  Cardiovascular:     Rate and Rhythm: Normal rate and regular rhythm.     Heart sounds: No murmur.  Pulmonary:     Effort: Pulmonary effort is normal. No respiratory distress.     Breath sounds: Normal breath sounds.  Abdominal:     Palpations: Abdomen is soft.     Tenderness: There is no abdominal tenderness.  Skin:    General: Skin is warm and dry.  Neurological:     Mental Status: He is alert.       ED Treatments / Results  Labs (all labs ordered are listed, but only abnormal results are displayed) Labs Reviewed  GROUP A STREP BY PCR  SARS CORONAVIRUS 2 (TAT 6-24 HRS)    EKG None  Radiology No results found.  Procedures Procedures (including critical care time)  Medications Ordered in ED Medications  oxymetazoline (AFRIN) 0.05 % nasal spray 1 spray (1 spray Each Nare Given 08/18/19 2018)     Initial Impression / Assessment and Plan / ED Course  I have reviewed the triage vital signs and the nursing notes.  Pertinent labs & imaging results that were available during my care of the patient were reviewed by me and considered in my medical decision making (see chart for details).       Kelly Patrick was evaluated in Emergency Department on 08/18/2019 for the symptoms described in the history of present illness. He was evaluated in the context of the global COVID-19 pandemic, which necessitated consideration that the patient might be at risk for infection with the SARS-CoV-2 virus that causes COVID-19. Institutional protocols and algorithms that pertain to the evaluation of patients at risk for COVID-19 are in a state of rapid change based on information released by regulatory bodies including the CDC and federal and state organizations. These policies and algorithms were followed during the patient's care in the ED. MDM  Strep screen is negative,  Covid test ordered.  I advised pt covid precautions and quarantine   Final Clinical Impressions(s) / ED Diagnoses   Final diagnoses:  Pharyngitis, unspecified etiology    ED Discharge Orders    None    An After Visit Summary was printed and given to the patient.    Fransico Meadow, Hershal Coria 08/18/19 2117    Deno Etienne, DO 08/18/19 2321

## 2019-08-18 NOTE — ED Triage Notes (Signed)
Pt began feeling sick with sore throat and nasal congestion last night. Endorses going to a party, but wears a mask. Pt temp during triage 100.4.

## 2019-08-19 ENCOUNTER — Telehealth: Payer: Self-pay | Admitting: Pediatrics

## 2019-08-19 LAB — SARS CORONAVIRUS 2 (TAT 6-24 HRS): SARS Coronavirus 2: NEGATIVE

## 2019-08-19 NOTE — Telephone Encounter (Signed)
Message relayed to mother- telemed visit scheduled for tomorrow at 2:30

## 2019-08-19 NOTE — Telephone Encounter (Signed)
Would you like to get him scheduled for a telemed visit today?

## 2019-08-19 NOTE — Telephone Encounter (Signed)
Please call back patient.  He only had symptoms for less than 5 days, usually no indication for antibiotics. Most likely viral.  I recommend salt water gargles, over the counter lozenges or throat spray for throat pain.  May take tylenol or Advil for pain/fevers.  I still recommend he quarantines as per his ER discharge as sometimes the COVID testing can be false negative.   You can schedule for televisit tomorrow under my [facility] schedule at 10, 11:30 or 2:30PM.

## 2019-08-19 NOTE — Telephone Encounter (Signed)
Mom called pt was seen in ed for congestion and fever. Started sunday night. negative strep & covid test. Asking for meds/antibiotics because ed did not prescribe any meds.  [phone] Mom, Sephiroth Mcluckie

## 2019-08-20 ENCOUNTER — Encounter: Payer: Self-pay | Admitting: Allergy

## 2019-08-20 ENCOUNTER — Ambulatory Visit (INDEPENDENT_AMBULATORY_CARE_PROVIDER_SITE_OTHER): Payer: Federal, State, Local not specified - PPO | Admitting: Allergy

## 2019-08-20 ENCOUNTER — Other Ambulatory Visit: Payer: Self-pay

## 2019-08-20 DIAGNOSIS — T7800XD Anaphylactic reaction due to unspecified food, subsequent encounter: Secondary | ICD-10-CM

## 2019-08-20 DIAGNOSIS — J069 Acute upper respiratory infection, unspecified: Secondary | ICD-10-CM

## 2019-08-20 DIAGNOSIS — J4520 Mild intermittent asthma, uncomplicated: Secondary | ICD-10-CM

## 2019-08-20 DIAGNOSIS — J301 Allergic rhinitis due to pollen: Secondary | ICD-10-CM | POA: Diagnosis not present

## 2019-08-20 DIAGNOSIS — K200 Eosinophilic esophagitis: Secondary | ICD-10-CM

## 2019-08-20 MED ORDER — DM-GUAIFENESIN ER 30-600 MG PO TB12: 1.0000 | ORAL_TABLET | Freq: Two times a day (BID) | ORAL | 1 refills | Status: DC

## 2019-08-20 NOTE — Patient Instructions (Addendum)
Monitor symptoms.  See website regarding COVID information. Please quarantine as recommended by the ER.  Check temperature and may take tylenol or NSAIDs to reduce fever. May take mucinex DM twice a day with plenty of water to help with congestion.  May take the following supplements:  Vitamin C 500mg  twice a day.  Zinc 75-100mg  once a day.  Vitamin D3 1000-4000 IU once a day. [email or web address]  Testing Locations (Monday - Friday, 8 a.m. - 3:30 p.m.) [facility]: [facility] [address], [place], [place] (entrance off [address])   EE (eosinophilic esophagitis) Monitor symptoms.   Seasonal allergic rhinitis due to pollen Past history - March 2020 skin testing was positive to grass, weed, tree, mold, cat, dog, cockroach, dust mite. 2020 Immunocap was positive additionally to ragweed.  Continue environmental control measures.  May use over the counter antihistamines such as Zyrtec (cetirizine), Claritin (loratadine), Allegra (fexofenadine), or Xyzal (levocetirizine) daily as needed.  May use Flonase 1-2 sprays daily for nasal congestion.  Mild intermittent asthma without complication  Daily controller medication(s):NONE  Prior to physical activity:May use albuterol rescue inhaler 2 puffs 5 to 15 minutes prior to strenuous physical activities.  Rescue medications:May use albuterol rescue inhaler 2 puffs or nebulizer every 4 to 6 hours as needed for shortness of breath, chest tightness, coughing, and wheezing. Monitor frequency of use.   Follow up in 3 months for regular follow up visit.   Sincerely,  Rexene Alberts, DO Allergy & Immunology  [facility] office: [phone] [facility] office: [place] office: [phone]

## 2019-08-20 NOTE — Progress Notes (Signed)
RE: Kelly Patrick MRN: [id number] DOB: [birth year] Date of Telemedicine Visit: 08/20/2019  Referring provider: No ref. provider found Primary care provider: Patient, No Pcp Per  Chief Complaint: Sore Throat (Televisit at home. Patient and mom gave verbal consent to treat and bill insurance for this visit.) and Fever  Telemedicine Follow Up Visit via Telephone: I connected with Kelly Patrick for a follow up on 08/20/19 by telephone and verified that I am speaking with the correct person using two identifiers.   I discussed the limitations, risks, security and privacy concerns of performing an evaluation and management service by telephone and the availability of in person appointments. I also discussed with the patient that there may be a patient responsible charge related to this service. The patient expressed understanding and agreed to proceed.  Patient is at home/work accompanied by mother who provided/contributed to the history.  Provider is at the office.  Visit start time: 2:24PM Visit end time: 3:06PM Insurance consent/check in by: front desk Medical consent and medical assistant/nurse: Vonzell Schlatter.  History of Present Illness: He is a 23 y.o. male, who is being followed for eosinophilic esophagitis, oral allergy syndrome, allergic rhinitis and asthma. His previous allergy office visit was on 02/10/2019 with Dr. Selena Batten via telemedicine. Today is a new complaint visit of Sore throat and fever.  Patient had a fever of 100.4 3 days ago and has not checked his temperature since then. He went to the ER on 10/6 and had negative COVID-19 and negative strep.  Symptoms seem to be doing better. Taking tylenol with good benefit.  Patient was at a party on 10/5 and was not a very socially distanced event. He is not aware of anyone having COVID at that party.   He is having some chest congestion and wants to take mucinex but they don't have any at the pharmacy apparently.   EE (eosinophilic  esophagitis) Doing well with no symptoms.  Currently avoiding peanuts, tree nuts, shellfish, soy, potato, tomato, rice and corn. Eating dairy.   Seasonal allergic rhinitis due to pollen Well controlled.   Mild intermittent asthma without complication No issues.   Assessment and Plan: Kelly Patrick is a 23 y.o. male with: Viral upper respiratory infection Improving symptoms but had Tmax of 100.4 3 days ago with sore throat. ER visit on 10/6 showed negative strep and negative COVID-19 test. 1 day prior he attended a party.   Discussed with patient he most likely as a viral URI. Can't rule out COVID. If symptoms persistent recommend to get retesting. See website regarding COVID information.  Please quarantine as recommended by the ER.   Check temperature and may take tylenol or NSAIDs to reduce fever.  May take mucinex DM twice a day with plenty of water to help with congestion.   May take the following supplements:  Vitamin C 500mg  twice a day.  Zinc 75-100mg  once a day.  Vitamin D3 1000-4000 IU once a day.  EE (eosinophilic esophagitis) Past history - Patient diagnosed with eosinophilic esophagitis a few years ago and was well controlled with budesonide BID. Last December 2019 he had repeat EGD which was clear and stopped budesonide. Since then he has been having abdominal pains but denies any other GI symptoms. Parents very concerned about what foods he can eat as he has been getting different instructions. He is being followed by GI and ENT as well. He had multiple sets of food/immunocap testing.  Interim history - Asymptomatic.   Monitor symptoms. Needs regular follow  up OV to discuss further.  Mild intermittent asthma without complication Well-controlled.  . Daily controller medication(s): NONE . Prior to physical activity: May use albuterol rescue inhaler 2 puffs 5 to 15 minutes prior to strenuous physical activities. Marland Kitchen Rescue medications: May use albuterol rescue inhaler 2  puffs or nebulizer every 4 to 6 hours as needed for shortness of breath, chest tightness, coughing, and wheezing. Monitor frequency of use.   Seasonal allergic rhinitis due to pollen Past history - March 2020 skin testing was positive to grass, weed, tree, mold, cat, dog, cockroach, dust mite. 2020 Immunocap was positive additionally to ragweed. Interim history - stable.   Continue environmental control measures.  May use over the counter antihistamines such as Zyrtec (cetirizine), Claritin (loratadine), Allegra (fexofenadine), or Xyzal (levocetirizine) daily as needed.  May use Flonase 1-2 sprays daily for nasal congestion.  Return in about 3 months (around 11/20/2019).  Meds ordered this encounter  Medications  . dextromethorphan-guaiFENesin (MUCINEX DM) 30-600 MG 12hr tablet    Sig: Take 1 tablet by mouth 2 (two) times daily.    Dispense:  30 tablet    Refill:  1   Diagnostics: None.  Medication List:  Current Outpatient Medications  Medication Sig Dispense Refill  . albuterol (PROAIR HFA) 108 (90 Base) MCG/ACT inhaler 2 puffs every 4 hours if needed for wheezing or coughing spells 1 Inhaler 1  . albuterol (PROVENTIL) (2.5 MG/3ML) 0.083% nebulizer solution Take 3 mLs (2.5 mg total) by nebulization every 6 (six) hours as needed for wheezing or shortness of breath. 75 mL 5  . EPINEPHrine 0.3 mg/0.3 mL IJ SOAJ injection Inject 0.3 mg into the muscle once.    . gabapentin (NEURONTIN) 100 MG capsule Take 200 mg by mouth 3 (three) times daily.    Marland Kitchen ibuprofen (ADVIL) 800 MG tablet Take 800 mg by mouth 3 (three) times daily.    Marland Kitchen loratadine (CLARITIN) 10 MG tablet Take by mouth.    . SUMAtriptan (IMITREX) 100 MG tablet Take 100 mg by mouth daily.    Marland Kitchen venlafaxine XR (EFFEXOR-XR) 37.5 MG 24 hr capsule TAKE 1 CAPSULE BY MOUTH ONCE DAILY FOR 7 DAYS THEN INCREASE TO 2 ONCE DAILY    . dextromethorphan-guaiFENesin (MUCINEX DM) 30-600 MG 12hr tablet Take 1 tablet by mouth 2 (two) times daily. 30  tablet 1  . fluticasone (FLONASE) 50 MCG/ACT nasal spray 1 spray per nostril twice a day for stuffy nose (Patient not taking: Reported on 08/20/2019) 18.2 g 5  . pantoprazole (PROTONIX) 40 MG tablet Take 40 mg by mouth daily.     No current facility-administered medications for this visit.    Allergies: Allergies  Allergen Reactions  . Sudafed  [Pseudoephedrine Hcl] Other (See Comments)    High blood pressure   . Cetirizine Other (See Comments)    Allergy test  . Pseudoephedrine Other (See Comments)  . Diphenhydramine     Other reaction(s): OTHER (explain in comments) agitation agitation agitation agitation agitation agitation agitation   I reviewed his past medical history, social history, family history, and environmental history and no significant changes have been reported from his previous visit.  Review of Systems  Constitutional: Positive for fever. Negative for appetite change, chills and unexpected weight change.  HENT: Positive for congestion and sore throat. Negative for rhinorrhea.   Eyes: Negative for itching.  Respiratory: Positive for cough. Negative for chest tightness, shortness of breath and wheezing.   Gastrointestinal: Negative for abdominal pain, nausea and vomiting.  Skin:  Negative for rash.  Allergic/Immunologic: Positive for environmental allergies and food allergies.  Neurological: Negative for headaches.   Objective: Physical Exam Not obtained as encounter was done via telephone.   Previous notes and tests were reviewed.  I discussed the assessment and treatment plan with the patient. The patient was provided an opportunity to ask questions and all were answered. The patient agreed with the plan and demonstrated an understanding of the instructions. After visit summary/patient instructions available via mychart.   The patient was advised to call back or seek an in-person evaluation if the symptoms worsen or if the condition fails to improve as  anticipated.  I provided 20 minutes of non-face-to-face time during this encounter.  It was my pleasure to participate in Harrisonorey Enerson's care today. Please feel free to contact me with any questions or concerns.   Sincerely,  Wyline MoodYoon Kim, DO Allergy & Immunology  [facility] [place] office: [phone] [facility] office: [phone] [place] office: [phone]

## 2019-08-20 NOTE — Assessment & Plan Note (Signed)
Past history - March 2020 skin testing was positive to grass, weed, tree, mold, cat, dog, cockroach, dust mite. 2020 Immunocap was positive additionally to ragweed. Interim history - stable.   Continue environmental control measures.  May use over the counter antihistamines such as Zyrtec (cetirizine), Claritin (loratadine), Allegra (fexofenadine), or Xyzal (levocetirizine) daily as needed.  May use Flonase 1-2 sprays daily for nasal congestion.

## 2019-08-20 NOTE — Assessment & Plan Note (Signed)
Past history - Patient diagnosed with eosinophilic esophagitis a few years ago and was well controlled with budesonide BID. Last December 2019 he had repeat EGD which was clear and stopped budesonide. Since then he has been having abdominal pains but denies any other GI symptoms. Parents very concerned about what foods he can eat as he has been getting different instructions. He is being followed by GI and ENT as well. He had multiple sets of food/immunocap testing.  Interim history - Asymptomatic.   Monitor symptoms. Needs regular follow up OV to discuss further.

## 2019-08-20 NOTE — Assessment & Plan Note (Signed)
Well-controlled.   Daily controller medication(s):NONE  Prior to physical activity:May use albuterol rescue inhaler 2 puffs 5 to 15 minutes prior to strenuous physical activities.  Rescue medications:May use albuterol rescue inhaler 2 puffs or nebulizer every 4 to 6 hours as needed for shortness of breath, chest tightness, coughing, and wheezing. Monitor frequency of use.  

## 2019-08-20 NOTE — Assessment & Plan Note (Addendum)
Improving symptoms but had Tmax of 100.4 3 days ago with sore throat. ER visit on 10/6 showed negative strep and negative COVID-19 test. 1 day prior he attended a party.   Discussed with patient he most likely as a viral URI. Can't rule out COVID. If symptoms persistent recommend to get retesting. See website regarding COVID information.  Please quarantine as recommended by the ER.   Check temperature and may take tylenol or NSAIDs to reduce fever.  May take mucinex DM twice a day with plenty of water to help with congestion.   May take the following supplements:  Vitamin C 500mg  twice a day.  Zinc 75-100mg  once a day.  Vitamin D3 1000-4000 IU once a day.

## 2019-08-24 ENCOUNTER — Emergency Department (HOSPITAL_COMMUNITY)
Admission: EM | Admit: 2019-08-24 | Discharge: 2019-08-25 | Disposition: A | Discharge status: Home / Self Care | Payer: Federal, State, Local not specified - PPO | Attending: Emergency Medicine | Admitting: Emergency Medicine

## 2019-08-24 ENCOUNTER — Other Ambulatory Visit: Payer: Self-pay

## 2019-08-24 ENCOUNTER — Emergency Department (HOSPITAL_COMMUNITY): Payer: Federal, State, Local not specified - PPO

## 2019-08-24 DIAGNOSIS — R05 Cough: Secondary | ICD-10-CM | POA: Insufficient documentation

## 2019-08-24 DIAGNOSIS — Z5321 Procedure and treatment not carried out due to patient leaving prior to being seen by health care provider: Secondary | ICD-10-CM | POA: Diagnosis not present

## 2019-08-24 DIAGNOSIS — R0602 Shortness of breath: Secondary | ICD-10-CM | POA: Diagnosis present

## 2019-08-24 NOTE — ED Triage Notes (Signed)
Pt here for evaluation of shob at rest x 1 week. NAD. Negative covid test last week. Denies pain. Endorses dry cough.

## 2019-08-24 NOTE — ED Notes (Signed)
Pt stated he could not wait eight hours and would go to urgent care in the morning.

## 2019-08-25 ENCOUNTER — Telehealth: Payer: Self-pay | Admitting: Allergy

## 2019-08-25 MED ORDER — ALBUTEROL SULFATE HFA 108 (90 BASE) MCG/ACT IN AERS: INHALATION_SPRAY | RESPIRATORY_TRACT | 1 refills | Status: DC

## 2019-08-25 MED ORDER — ALBUTEROL SULFATE (2.5 MG/3ML) 0.083% IN NEBU: 2.5000 mg | INHALATION_SOLUTION | Freq: Four times a day (QID) | RESPIRATORY_TRACT | 5 refills | Status: DC | PRN

## 2019-08-25 NOTE — Telephone Encounter (Signed)
Pt mom call his need proair and albuterol called into [facility] on [address]. [phone].

## 2019-08-25 NOTE — Telephone Encounter (Signed)
Refills sent, unable to let patient know due to VM being full

## 2019-08-26 ENCOUNTER — Encounter: Payer: Self-pay | Admitting: Allergy

## 2019-08-26 ENCOUNTER — Telehealth: Payer: Self-pay | Admitting: *Deleted

## 2019-08-26 ENCOUNTER — Ambulatory Visit (INDEPENDENT_AMBULATORY_CARE_PROVIDER_SITE_OTHER): Payer: Federal, State, Local not specified - PPO | Admitting: Allergy

## 2019-08-26 DIAGNOSIS — J4521 Mild intermittent asthma with (acute) exacerbation: Secondary | ICD-10-CM

## 2019-08-26 MED ORDER — PULMICORT FLEXHALER 180 MCG/ACT IN AEPB: 1.0000 | INHALATION_SPRAY | Freq: Two times a day (BID) | RESPIRATORY_TRACT | 2 refills | Status: DC

## 2019-08-26 NOTE — Assessment & Plan Note (Signed)
Patient has increased SOB and coughing the last 2 days since moved into an apartment with mold issues. This is being remedied. He has not used any inhalers. No more fevers or chills.   He most likely has asthma exacerbation due to exospore to mold.  . Daily controller medication(s): start Pulmicort 180 1 puff twice a day and rinse mouth afterwards for 2 weeks. Patient used Pulmicort previously with good benefit.  o If not feel better after using Pulmicort and albuterol may need a course of prednisone as well.  . Prior to physical activity: May use albuterol rescue inhaler 2 puffs 5 to 15 minutes prior to strenuous physical activities. Marland Kitchen Rescue medications: May use albuterol rescue inhaler 2 puffs or nebulizer every 4 to 6 hours as needed for shortness of breath, chest tightness, coughing, and wheezing. Monitor frequency of use.  . During upper respiratory infections/asthma flares: Start Pulmicort 180 1 puff twice a day and rinse mouth afterwards for 2 weeks. . Get spirometry at next visit. . Letter written.

## 2019-08-26 NOTE — Patient Instructions (Addendum)
Asthma: . Daily controller medication(s): start Pulmicort 180 1 puff twice a day and rinse mouth afterwards for 2 weeks.  o If you do not feel better after using Pulmicort and albuterol by Friday please let us know. . Prior to physical activity: May use albuterol rescue inhaler 2 puffs 5 to 15 minutes prior to strenuous physical activities. Marland Kitchen Rescue medications: May use albuterol rescue inhaler 2 puffs or nebulizer every 4 to 6 hours as needed for shortness of breath, chest tightness, coughing, and wheezing. Monitor frequency of use.  . During upper respiratory infections/asthma flares: Start Pulmicort 180 1 puff twice a day and rinse mouth afterwards for 2 weeks. . Asthma control goals:  o Full participation in all desired activities (may need albuterol before activity) o Albuterol use two times or less a week on average (not counting use with activity) o Cough interfering with sleep two times or less a month o Oral steroids no more than once a year o No hospitalizations  Follow up in 2 months for regular check up visit.  Office will call you once letter is ready to be picked up.  Sincerely,  Rexene Alberts, DO Allergy & Immunology  [facility] office: [phone] [facility] office: [place] office: [phone]

## 2019-08-26 NOTE — Progress Notes (Signed)
RE: Kelly Patrick MRN: [id number] DOB: [birth year] Date of Telemedicine Visit: 08/26/2019  Referring provider: No ref. provider found Primary care provider: Patient, Kelly Patrick  Chief Complaint: Asthma, EOE, and Shortness of Breath (whenever talking to long, and a dry cough througout the day)   Telemedicine Follow Up Visit via Telephone: I connected with Kelly Patrick for a follow up on 08/26/19 by telephone and verified that I am speaking with the correct person using two identifiers.   I discussed the limitations, risks, security and privacy concerns of performing an evaluation and management service by telephone and the availability of in person appointments. I also discussed with the Kelly Patrick that there may be a Kelly Patrick responsible charge related to this service. The Kelly Patrick expressed understanding and agreed to proceed.  Kelly Patrick is at home/work accompanied by mother who provided/contributed to the history.  Provider is at the office.  Visit start time: 2:50PM Visit end time: 3:10 Insurance consent/check in by: front desk Medical consent and medical assistant/nurse: Lisabeth Pick S.  History of Present Illness: He is a 23 y.o. male, who is being followed for eoe, asthma and allergic rhinitis. His previous allergy office visit was on 10/8//2020 with Dr. Maudie Mercury via telemedicine. Today is a new complaint visit of ER follow up.  Kelly Patrick went to the ER 2 days ago due to SOB which came on gradually. He also had some coughing with this but Kelly phlegm. This can occur at rest. Denies any wheezing. He left the ER before evaluation as he could not wait for 8 hours.   He believes it's the apartment he is in which seems to have some mold issues. He moved into this apartment last Friday. Symptoms started afterwards.  Some PND but not using nasal sprays. Did not use albuterol inhaler yet. The apartment is fixing the mold issue but they would like a letter as well from our office in case he does not get better and  so they can break their lease.   Kelly fevers or sore throat. Stopped Mucinex 1 day ago.  Kelly repeat COVID testing.   Assessment and Plan: Kelly Patrick is a 23 y.o. male with: Mild intermittent asthma with (acute) exacerbation Kelly Patrick has increased SOB and coughing the last 2 days since moved into an apartment with mold issues. This is being remedied. He has not used any inhalers. Kelly more fevers or chills.   He most likely has asthma exacerbation due to exospore to mold.  . Daily controller medication(s): start Pulmicort 180 1 puff twice a day and rinse mouth afterwards for 2 weeks. Kelly Patrick used Pulmicort previously with good benefit.  o If not feel better after using Pulmicort and albuterol may need a course of prednisone as well.  . Prior to physical activity: May use albuterol rescue inhaler 2 puffs 5 to 15 minutes prior to strenuous physical activities. Marland Kitchen Rescue medications: May use albuterol rescue inhaler 2 puffs or nebulizer every 4 to 6 hours as needed for shortness of breath, chest tightness, coughing, and wheezing. Monitor frequency of use.  . During upper respiratory infections/asthma flares: Start Pulmicort 180 1 puff twice a day and rinse mouth afterwards for 2 weeks. . Get spirometry at next visit. . Letter written.   Return in about 2 months (around 10/26/2019).  Meds ordered this encounter  Medications  . budesonide (PULMICORT FLEXHALER) 180 MCG/ACT inhaler    Sig: Inhale 1 puff into the lungs 2 (two) times daily.    Dispense:  1 each  Refill:  2   Diagnostics: None.  Medication List:  Current Outpatient Medications  Medication Sig Dispense Refill  . albuterol (PROAIR HFA) 108 (90 Base) MCG/ACT inhaler 2 puffs every 4 hours if needed for wheezing or coughing spells 18 g 1  . albuterol (PROVENTIL) (2.5 MG/3ML) 0.083% nebulizer solution Take 3 mLs (2.5 mg total) by nebulization every 6 (six) hours as needed for wheezing or shortness of breath. 75 mL 5  .  dextromethorphan-guaiFENesin (MUCINEX DM) 30-600 MG 12hr tablet Take 1 tablet by mouth 2 (two) times daily. 30 tablet 1  . EPINEPHrine 0.3 mg/0.3 mL IJ SOAJ injection Inject 0.3 mg into the muscle once.    . gabapentin (NEURONTIN) 100 MG capsule Take 200 mg by mouth 3 (three) times daily.    Marland Kitchen ibuprofen (ADVIL) 800 MG tablet Take 800 mg by mouth 3 (three) times daily.    Marland Kitchen loratadine (CLARITIN) 10 MG tablet Take by mouth.    . budesonide (PULMICORT FLEXHALER) 180 MCG/ACT inhaler Inhale 1 puff into the lungs 2 (two) times daily. 1 each 2   Kelly current facility-administered medications for this visit.    Allergies: Allergies  Allergen Reactions  . Sudafed  [Pseudoephedrine Hcl] Other (See Comments)    High blood pressure   . Cetirizine Other (See Comments)    Allergy test  . Pseudoephedrine Other (See Comments)  . Diphenhydramine     Other reaction(s): OTHER (explain in comments) agitation agitation agitation agitation agitation agitation agitation   I reviewed his past medical history, social history, family history, and environmental history and Kelly significant changes have been reported from his previous visit.  Review of Systems  Constitutional: Negative for appetite change, chills, fever and unexpected weight change.  HENT: Negative for congestion, rhinorrhea and sore throat.   Eyes: Negative for itching.  Respiratory: Positive for cough and shortness of breath. Negative for chest tightness and wheezing.   Gastrointestinal: Negative for abdominal pain, nausea and vomiting.  Skin: Negative for rash.  Allergic/Immunologic: Positive for environmental allergies and food allergies.  Neurological: Negative for headaches.   Objective: Physical Exam Not obtained as encounter was done via telephone.  Kelly coughing during exam. Kelly Patrick speaking in full sentences without distress.   Previous notes and tests were reviewed.  I discussed the assessment and treatment plan with the  Kelly Patrick. The Kelly Patrick was provided an opportunity to ask questions and all were answered. The Kelly Patrick agreed with the plan and demonstrated an understanding of the instructions. After visit summary/Kelly Patrick instructions available via mychart.   The Kelly Patrick was advised to call back or seek an in-person evaluation if the symptoms worsen or if the condition fails to improve as anticipated.  I provided 20 minutes of non-face-to-face time during this encounter.  It was my pleasure to participate in Kelly Patrick's care today. Please feel free to contact me with any questions or concerns.   Sincerely,  Wyline Mood, DO Allergy & Immunology  [facility] office: [phone] [facility] office: [phone] [place] office: [phone]

## 2019-08-27 ENCOUNTER — Telehealth: Payer: Self-pay

## 2019-08-27 ENCOUNTER — Telehealth: Payer: Self-pay | Admitting: Allergy

## 2019-08-27 MED ORDER — FLOVENT HFA 110 MCG/ACT IN AERO: 2.0000 | INHALATION_SPRAY | Freq: Two times a day (BID) | RESPIRATORY_TRACT | 5 refills | Status: DC

## 2019-08-27 NOTE — Telephone Encounter (Signed)
PT mom called because she went to pick up meds and pulmicort with insurance is $140 and pt mom cannot afford. Needs covered alternative [phone].

## 2019-08-27 NOTE — Telephone Encounter (Signed)
-----   Message from Garnet Sierras, DO sent at 08/26/2019  6:00 PM EDT ----- Please call patient and let him know that the letter is ready for pick up.

## 2019-08-27 NOTE — Telephone Encounter (Signed)
Pharmacy stated flovent is cover at $35 for a month

## 2019-08-27 NOTE — Addendum Note (Signed)
Addended by: Felipa Emory on: 08/27/2019 04:41 PM   Modules accepted: Orders

## 2019-08-27 NOTE — Telephone Encounter (Signed)
Please advise: PT mom called because she went to pick up meds and pulmicort with insurance is $140 and pt mom cannot afford. Needs covered alternative [phone].

## 2019-08-27 NOTE — Telephone Encounter (Signed)
Attempted to call patient but unable to reach them. Will send a Mychart message, Patient will have to collect the letter in the [place] office.

## 2019-08-27 NOTE — Telephone Encounter (Signed)
She needs to check with her insurance which inhaler is the cheapest with her coverage.  There is no way for [place] to do this.  Please give her these alternatives as they are usually the ones that are covered more frequently OR she can ask the insurance/pharmacy which one would be the cheapest for her.   Flovent 145mcg 2 puffs twice a day. Arnuity Ellipta 200 mcg 1 puff once a day.

## 2019-08-28 ENCOUNTER — Ambulatory Visit: Payer: Federal, State, Local not specified - PPO | Admitting: Allergy

## 2019-08-28 NOTE — Telephone Encounter (Signed)
Mother informed that letter was ready for pick up in [place] office.

## 2020-06-30 ENCOUNTER — Emergency Department (HOSPITAL_COMMUNITY)
Admission: EM | Admit: 2020-06-30 | Discharge: 2020-06-30 | Disposition: A | Discharge status: Home / Self Care | Payer: Federal, State, Local not specified - PPO | Attending: Emergency Medicine | Admitting: Emergency Medicine

## 2020-06-30 ENCOUNTER — Encounter (HOSPITAL_COMMUNITY): Payer: Self-pay

## 2020-06-30 ENCOUNTER — Other Ambulatory Visit: Payer: Self-pay

## 2020-06-30 DIAGNOSIS — J4521 Mild intermittent asthma with (acute) exacerbation: Secondary | ICD-10-CM | POA: Insufficient documentation

## 2020-06-30 DIAGNOSIS — Y929 Unspecified place or not applicable: Secondary | ICD-10-CM | POA: Insufficient documentation

## 2020-06-30 DIAGNOSIS — Y939 Activity, unspecified: Secondary | ICD-10-CM | POA: Insufficient documentation

## 2020-06-30 DIAGNOSIS — M545 Low back pain, unspecified: Secondary | ICD-10-CM

## 2020-06-30 DIAGNOSIS — R519 Headache, unspecified: Secondary | ICD-10-CM | POA: Diagnosis not present

## 2020-06-30 DIAGNOSIS — Y999 Unspecified external cause status: Secondary | ICD-10-CM | POA: Diagnosis not present

## 2020-06-30 MED ORDER — NAPROXEN 500 MG PO TABS: 500.0000 mg | ORAL_TABLET | Freq: Two times a day (BID) | ORAL | 0 refills | Status: DC

## 2020-06-30 MED ORDER — METHOCARBAMOL 500 MG PO TABS: 500.0000 mg | ORAL_TABLET | Freq: Two times a day (BID) | ORAL | 0 refills | Status: DC
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
## 2020-06-30 NOTE — Discharge Instructions (Addendum)
1. Medications: robaxin, naproxyn, usual home medications 2. Treatment: rest, drink plenty of fluids, gentle stretching as discussed, alternate ice and heat 3. Follow Up: Please followup with your primary doctor in 3-5 days for discussion of your diagnoses and further evaluation after today's visit; if you do not have a primary care doctor use the resource guide provided to find one;  Return to the ER for worsening back pain, difficulty walking, loss of bowel or bladder control or other concerning symptoms
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
## 2020-06-30 NOTE — ED Notes (Signed)
Patient ambulatory to the lobby with a steady gait. 

## 2020-07-04 ENCOUNTER — Emergency Department (HOSPITAL_COMMUNITY)
Admission: EM | Admit: 2020-07-04 | Discharge: 2020-07-05 | Discharge status: Against Medical Advice | Payer: Federal, State, Local not specified - PPO

## 2020-07-04 ENCOUNTER — Other Ambulatory Visit: Payer: Self-pay

## 2020-07-08 ENCOUNTER — Ambulatory Visit: Payer: Federal, State, Local not specified - PPO | Admitting: Physician Assistant

## 2020-10-13 ENCOUNTER — Other Ambulatory Visit: Payer: Self-pay

## 2020-10-13 ENCOUNTER — Emergency Department (HOSPITAL_COMMUNITY)
Admission: EM | Admit: 2020-10-13 | Discharge: 2020-10-13 | Disposition: A | Discharge status: Home / Self Care | Payer: Federal, State, Local not specified - PPO | Attending: Emergency Medicine | Admitting: Emergency Medicine

## 2020-10-13 DIAGNOSIS — J029 Acute pharyngitis, unspecified: Secondary | ICD-10-CM

## 2020-10-13 DIAGNOSIS — R070 Pain in throat: Secondary | ICD-10-CM | POA: Diagnosis present

## 2020-10-13 DIAGNOSIS — J4521 Mild intermittent asthma with (acute) exacerbation: Secondary | ICD-10-CM | POA: Insufficient documentation

## 2020-10-13 LAB — GROUP A STREP BY PCR: Group A Strep by PCR: NOT DETECTED

## 2020-10-13 NOTE — Discharge Instructions (Signed)
Your strep test here in the ED was negative.  I suspect that this is a viral pharyngitis.  Recommend over-the-counter NSAIDs such as ibuprofen 600 mg every 6 hours as needed for sore throat symptoms.  I also encourage you to continue drinking plenty of fluids.  I personally use warm tea with honey as honey is a natural anti-inflammatory and to ease his sore throat symptoms.  I also recommend throat lozenges and salt water gargles for ongoing symptoms.  Please look into [facility] your [facility].  If not, follow-up with your primary care provider for ongoing evaluation and management.  Return to the ED or seek immediate medical attention should you experience any new or worsening symptoms.

## 2020-10-13 NOTE — ED Triage Notes (Signed)
Pt reports he has had a dry, productive cough x 4 days. Denies any other complaints or symptoms.

## 2020-10-13 NOTE — ED Provider Notes (Signed)
[facility]-EMERGENCY DEPT Provider Note   CSN: [id number] Arrival date & time: 10/13/20  0946     History Chief Complaint  Patient presents with   Sore Throat    Kelly Patrick is a 24 y.o. male with past medical history of asthma presents the ED with a 4-day history of sore throat symptoms.  Patient reports that he has been diagnosed with strep pharyngitis on multiple occasions in the past 6 months and was concerned that he was once again developing strep.  He came to the ED solely for testing.  He states that overall he has been feeling quite well.  He recently traveled to [place] for Thanksgiving, but drove both ways.  He is fully immunized for COVID-19.  He denies any congestion, headache, fevers or chills, chest pain, shortness of breath, difficulty eating or drinking, facial swelling, neck pain, or other symptoms.  He has not taken anything for his symptoms.  He is an Acupuncturist at [employer].  He believes that there is a [employer] there, but has not gone.  He states that he has not had to use his inhaler in years and denies any wheezing or SOB.    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 Authorizing Provider  albuterol (PROAIR HFA) 108 (90 Base) MCG/ACT inhaler 2 puffs every 4 hours if needed for wheezing or coughing spells 08/25/19   Ellamae Sia, DO  albuterol (PROVENTIL) (2.5 MG/3ML) 0.083% nebulizer solution Take 3 mLs (2.5 mg total) by nebulization every 6 (six) hours as needed for wheezing or shortness of breath. 08/25/19   Ellamae Sia, DO  budesonide (PULMICORT FLEXHALER) 180 MCG/ACT inhaler Inhale 1 puff into the lungs 2 (two) times daily. 08/26/19   Ellamae Sia, DO  dextromethorphan-guaiFENesin (MUCINEX DM) 30-600 MG 12hr tablet Take 1 tablet by mouth 2 (two) times daily. 08/20/19   Ellamae Sia, DO  EPINEPHrine 0.3 mg/0.3 mL IJ SOAJ injection Inject 0.3 mg into the muscle once. 01/12/19   [provider]  fluticasone (FLOVENT HFA) 110 MCG/ACT inhaler Inhale 2 puffs into the lungs 2 (two) times daily. 08/27/19   Ellamae Sia, DO  gabapentin (NEURONTIN) 100 MG capsule Take 200 mg by mouth 3 (three)  times daily. 08/01/19   [provider]  ibuprofen (ADVIL) 800 MG tablet Take 800 mg by mouth 3 (three) times daily. 04/13/19   [provider]  loratadine (CLARITIN) 10 MG tablet Take by mouth.    [provider]  methocarbamol (ROBAXIN) 500 MG tablet Take 1 tablet (500 mg total) by mouth 2 (two) times daily. 06/30/20   Muthersbaugh, Dahlia Client, PA-C  naproxen (NAPROSYN) 500 MG tablet Take 1 tablet (500 mg total) by mouth 2 (two) times daily with a meal. 06/30/20   Muthersbaugh, Dahlia Client, PA-C    Allergies    Sudafed  [pseudoephedrine hcl], Cetirizine, Pseudoephedrine, and Diphenhydramine  Review of Systems   Review of Systems  Constitutional: Negative  for fever.  HENT: Positive for sore throat. Negative for facial swelling, trouble swallowing and voice change.   Respiratory: Negative for shortness of breath and wheezing.   Gastrointestinal: Negative for abdominal pain.    Physical Exam Updated Vital Signs BP 138/80 (BP Location: Left Arm)    Pulse (!) 50    Temp 97.8 F (36.6 C) (Oral)    Resp 16    SpO2 99%   Physical Exam Vitals and nursing note reviewed. Exam conducted with a chaperone present.  Constitutional:      General: He is not in acute distress.    Appearance: Normal appearance. He is not ill-appearing.  HENT:     Head: Normocephalic and atraumatic.     Mouth/Throat:     Pharynx: Oropharynx is clear. No oropharyngeal exudate.     Comments: Patent oropharynx.  No uvular deviation or swelling.  No exudates noted.  No trismus.  Tolerating secretions well.  No asymmetries.  Floor of mouth soft, nonswollen.  No facial swelling.  No significant tonsillar hypertrophy. Eyes:     General: No scleral icterus.    Conjunctiva/sclera: Conjunctivae normal.     Pupils: Pupils are equal, round, and reactive to light.  Pulmonary:     Effort: Pulmonary effort is normal.  Skin:    General: Skin is dry.  Neurological:     Mental Status: He is alert.     GCS: GCS eye subscore is 4. GCS verbal subscore is 5. GCS motor subscore is 6.  Psychiatric:        Mood and Affect: Mood normal.        Behavior: Behavior normal.        Thought Content: Thought content normal.     ED Results / Procedures / Treatments   Labs (all labs ordered are listed, but only abnormal results are displayed) Labs Reviewed  GROUP A STREP BY PCR    EKG None  Radiology No results found.  Procedures Procedures (including critical care time)  Medications Ordered in ED Medications - No data to display  ED Course  I have reviewed the triage vital signs and the nursing notes.  Pertinent labs & imaging results that were available during my care of the  patient were reviewed by me and considered in my medical decision making (see chart for details).    MDM Rules/Calculators/A&P                          Patient able to speak in complete sentences without difficulty swallowing or trouble handling secretions.  No stridor, wheezing, tripoding, grey pseudomembrane, trismus, uvular deviation, sublingual/submandibular/submental swelling or induration, nuchal rigidity, or neck pain.  Low suspicion for deep tissue infection such as PTA or RPA, epiglottitis,  vertebral dissection, meningitis, or other emergent pathology.    Discussed conservative therapy such as warm tea with honey, Chloraseptic spray, throat lozenges, and salt-water gargles.  Encouraged NSAIDs and emphasized importance of oral hydration and antipyretics as needed for fever control.    Offered respiratory panel by PCR, but patient declines.  He is fully immunized and denies any obvious sick contacts.  No other symptoms aside from his mild sore throat.  The mild dry cough that he reported in triage is more of "clearing his throat".  He was only concerned about possible strep pharyngitis.  Reassured by today's limited work-up.  I do not feel as though further work-up is warranted given that patient is well-appearing with reassuring vitals and physical exam.  Patient can follow[facility] with [facility] wellness [facility].  Strict ED return precautions discussed.  Patient voices understanding and is agreeable to the plan.  Final Clinical Impression(s) / ED Diagnoses Final diagnoses:  Viral pharyngitis    Rx / DC Orders ED Discharge Orders    None       Lorelee New, PA-C 10/13/20 1332    Gerhard Munch, MD 10/13/20 1657

## 2020-12-19 ENCOUNTER — Telehealth: Payer: Self-pay | Admitting: Allergy

## 2020-12-19 ENCOUNTER — Other Ambulatory Visit: Payer: Self-pay

## 2020-12-19 MED ORDER — ALBUTEROL SULFATE HFA 108 (90 BASE) MCG/ACT IN AERS: INHALATION_SPRAY | RESPIRATORY_TRACT | 1 refills | Status: DC

## 2020-12-19 MED ORDER — PULMICORT FLEXHALER 180 MCG/ACT IN AEPB: 1.0000 | INHALATION_SPRAY | Freq: Two times a day (BID) | RESPIRATORY_TRACT | 0 refills | Status: DC

## 2020-12-19 NOTE — Telephone Encounter (Signed)
Patient is needing a refill on RX budesonide (PULMICORT FLEXHALER patinet is also asking for a nebulizer machine to be ordered please advise

## 2020-12-25 NOTE — Patient Instructions (Incomplete)
Mild intermittent asthma Continue albuterol 2 puffs every 4 hours as needed for cough, wheeze, tightness in chest, or shortness of breath.  For asthma flares/ upper respiratory infections start Pulmicort 180 mcg 1 puff twice a day for 2 weeks  Eosinophilic esophagitis Currently avoiding peanuts, tree nuts, shellfish, soy, potato, tomato, rice, and corn. Eating dairy  Seasonal allergic rhinitis ( grass, weeds, tree, mold, cat, dog, cockroach, dustmite, and ragweed) Continue Flonase (fluticasone) nasal spray 1-2 sprays each nostril once a day as needed for stuffy nose. May use an over the counter antihistamine such as Claritin, Xyzal, or Allegra once a day as needed for runny nose/itching  Please let us know if this treatment plan is not working well for you. Schedule a follow up appointment in

## 2020-12-27 ENCOUNTER — Ambulatory Visit: Payer: Federal, State, Local not specified - PPO | Admitting: Family

## 2021-01-02 NOTE — Telephone Encounter (Signed)
Pt request refill for albuterol (PROVENTIL) (2.5 MG/3ML) 0.083% nebulizer solution. Pt has appt on 3/1

## 2021-01-09 NOTE — Patient Instructions (Addendum)
Mild intermittent asthma Continue albuterol 2 puffs every 4 hours as needed for cough, wheeze, tightness in chest, or shortness of breath.  For asthma flares/ upper respiratory infections start Pulmicort 180 mcg 1 puff twice a day for 2 weeks . Go ahead and start this now.  Eosinophilic esophagitis-doing well  Currently avoiding peanuts, tree nuts, shellfish, soy, potato, tomato, rice, and corn. Eating dairy  Seasonal allergic rhinitis ( grass, weeds, tree, mold, cat, dog, cockroach, dustmite, and ragweed) Continue Flonase (fluticasone) nasal spray 1-2 sprays each nostril once a day as needed for stuffy nose. May use an over the counter antihistamine such as Claritin, Xyzal, or Allegra once a day as needed for runny nose/itching  Please let us know if this treatment plan is not working well for you. Schedule a follow up appointment in 2 months or sooner if needed

## 2021-01-10 ENCOUNTER — Ambulatory Visit (INDEPENDENT_AMBULATORY_CARE_PROVIDER_SITE_OTHER): Payer: Federal, State, Local not specified - PPO | Admitting: Family

## 2021-01-10 ENCOUNTER — Encounter: Payer: Self-pay | Admitting: Family

## 2021-01-10 ENCOUNTER — Other Ambulatory Visit: Payer: Self-pay

## 2021-01-10 VITALS — BP 102/58 | HR 54 | Temp 98.1°F | Resp 16 | Ht 70.5 in | Wt 167.4 lb

## 2021-01-10 DIAGNOSIS — K200 Eosinophilic esophagitis: Secondary | ICD-10-CM | POA: Diagnosis not present

## 2021-01-10 DIAGNOSIS — J4520 Mild intermittent asthma, uncomplicated: Secondary | ICD-10-CM | POA: Diagnosis not present

## 2021-01-10 DIAGNOSIS — J301 Allergic rhinitis due to pollen: Secondary | ICD-10-CM

## 2021-01-10 DIAGNOSIS — T781XXD Other adverse food reactions, not elsewhere classified, subsequent encounter: Secondary | ICD-10-CM | POA: Diagnosis not present

## 2021-01-10 MED ORDER — PULMICORT FLEXHALER 180 MCG/ACT IN AEPB: 1.0000 | INHALATION_SPRAY | Freq: Two times a day (BID) | RESPIRATORY_TRACT | 5 refills | Status: DC

## 2021-01-10 NOTE — Progress Notes (Signed)
[address] [place] [address] Dept: [phone]  FOLLOW UP NOTE  Patient ID: Kelly Patrick, male    DOB: [birth year]  Age: 25 y.o. MRN: [id number] Date of Office Visit: 01/10/2021  Assessment  Chief Complaint: Asthma  HPI Kelly Patrick is a 25 year old male that presents today for follow-up of mild intermittent asthma, eosinophilic esophagitis, allergic rhinitis, and pollen food allergy syndrome.  He was last seen on August 26, 2019 by Dr. Selena Batten.  Mild intermittent asthma is reported as moderately controlled with albuterol as needed and Pulmicort 180 mcg 2 puffs once a day as needed.  He reports productive cough with clear sputum for the past few weeks and wheezing past physical activity.  He denies tightness in his chest, shortness of breath, and nocturnal awakenings.  Since his last office visit he has not required any systemic steroids or made any trips to the emergency room or urgent care due to breathing problems.  He has not had to use his albuterol any in the past year or two.  Eosinophilic esophagitis is reported as controlled.  He denies any difficulty swallowing.  He is currently avoiding peanuts, tree nuts, shellfish, soy, potato, tomato, rice, and corn.  He is able to eat dairy.  Allergic rhinitis is reported as controlled with over-the-counter Allergy Relief.  He is currently not using Flonase nasal spray at this time.  He reports that he is out, but is not interested at this time in getting any refills.  He reports a small amount of rhinorrhea, nasal congestion, and postnasal drip.  He feels that over-the-counter Allergy Relief helps control his symptoms.   Drug Allergies:  Allergies  Allergen Reactions  . Sudafed  [Pseudoephedrine Hcl] Other (See Comments)    High blood pressure   . Cetirizine Other (See Comments)    Allergy test  . Pseudoephedrine Other (See Comments)  . Diphenhydramine     Other reaction(s): OTHER (explain in  comments) agitation agitation agitation agitation agitation agitation agitation    Review of Systems: Review of Systems  Constitutional: Negative for chills and fever.  HENT:       Reports a little bit of rhinorrhea at times a little bit of nasal congestion at times and little bit of postnasal drip at times  Eyes:       Denies itchy watery eyes  Respiratory: Positive for cough and wheezing. Negative for shortness of breath.        Reports a minor cough that is productive with clear sputum over the past week and reports wheezing past physical activity.  Denies tightness in his chest, shortness of breath, and nocturnal awakening  Cardiovascular: Negative for chest pain and palpitations.  Gastrointestinal: Negative for abdominal pain and heartburn.       Denies difficulty swallowing  Genitourinary: Negative for dysuria.  Skin: Negative for itching and rash.  Neurological: Negative for headaches.  Endo/Heme/Allergies: Positive for environmental allergies.    Physical Exam: BP (!) 102/58   Pulse (!) 54   Temp 98.1 F (36.7 C) (Temporal)   Resp 16   Ht 5' 10.5" (1.791 m)   Wt 167 lb 6.4 oz (75.9 kg)   SpO2 98%   BMI 23.68 kg/m    Physical Exam Constitutional:      Appearance: Normal appearance.  HENT:     Head: Normocephalic and atraumatic.     Right Ear: Tympanic membrane, ear canal and external ear normal.     Left Ear: Tympanic membrane, ear canal and  external ear normal.     Nose: Nose normal.     Mouth/Throat:     Mouth: Mucous membranes are moist.     Pharynx: Oropharynx is clear.  Eyes:     Conjunctiva/sclera: Conjunctivae normal.  Cardiovascular:     Rate and Rhythm: Regular rhythm.     Heart sounds: Normal heart sounds.  Pulmonary:     Effort: Pulmonary effort is normal.     Breath sounds: Normal breath sounds.     Comments: Lungs clear to auscultate Musculoskeletal:     Cervical back: Neck supple.  Skin:    General: Skin is warm.  Neurological:      Mental Status: He is alert and oriented to person, place, and time.  Psychiatric:        Mood and Affect: Mood normal.        Behavior: Behavior normal.        Thought Content: Thought content normal.        Judgment: Judgment normal.     Diagnostics: FVC 4.79 L, FEV1 3.81 L.  Predicted FVC 4.84 L, FEV1 4.11 L.  Spirometry indicates normal ventilatory function.  Post bronchodilator response shows FVC 4.72 L, FEV1 3.95 L.  Spirometry indicates normal ventilatory function with no significant bronchodilator response.  Assessment and Plan: 1. Mild intermittent asthma without complication   2. Seasonal allergic rhinitis due to pollen   3. EE (eosinophilic esophagitis)   4. Pollen-food allergy, subsequent encounter     Meds ordered this encounter  Medications  . budesonide (PULMICORT FLEXHALER) 180 MCG/ACT inhaler    Sig: Inhale 1 puff into the lungs 2 (two) times daily.    Dispense:  1 each    Refill:  5    Patient Instructions  Mild intermittent asthma Continue albuterol 2 puffs every 4 hours as needed for cough, wheeze, tightness in chest, or shortness of breath.  For asthma flares/ upper respiratory infections start Pulmicort 180 mcg 1 puff twice a day for 2 weeks . Go ahead and start this now.  Eosinophilic esophagitis-doing well  Currently avoiding peanuts, tree nuts, shellfish, soy, potato, tomato, rice, and corn. Eating dairy  Seasonal allergic rhinitis ( grass, weeds, tree, mold, cat, dog, cockroach, dustmite, and ragweed) Continue Flonase (fluticasone) nasal spray 1-2 sprays each nostril once a day as needed for stuffy nose. May use an over the counter antihistamine such as Claritin, Xyzal, or Allegra once a day as needed for runny nose/itching  Please let us know if this treatment plan is not working well for you. Schedule a follow up appointment in 2 months or sooner if needed   Return in about 2 months (around 03/12/2021), or if symptoms worsen or fail to improve.     Thank you for the opportunity to care for this patient.  Please do not hesitate to contact me with questions.  Nehemiah Settle, FNP [facility]

## 2021-01-13 ENCOUNTER — Other Ambulatory Visit: Payer: Self-pay | Admitting: Allergy

## 2021-09-10 ENCOUNTER — Other Ambulatory Visit: Payer: Self-pay

## 2021-09-10 ENCOUNTER — Encounter (HOSPITAL_COMMUNITY): Payer: Self-pay | Admitting: Emergency Medicine

## 2021-09-10 ENCOUNTER — Emergency Department (HOSPITAL_COMMUNITY)
Admission: EM | Admit: 2021-09-10 | Discharge: 2021-09-10 | Disposition: A | Discharge status: Home / Self Care | Payer: Federal, State, Local not specified - PPO | Attending: Emergency Medicine | Admitting: Emergency Medicine

## 2021-09-10 DIAGNOSIS — F1010 Alcohol abuse, uncomplicated: Secondary | ICD-10-CM | POA: Diagnosis not present

## 2021-09-10 DIAGNOSIS — R002 Palpitations: Secondary | ICD-10-CM | POA: Diagnosis not present

## 2021-09-10 DIAGNOSIS — E860 Dehydration: Secondary | ICD-10-CM | POA: Diagnosis not present

## 2021-09-10 DIAGNOSIS — R42 Dizziness and giddiness: Secondary | ICD-10-CM | POA: Diagnosis not present

## 2021-09-10 DIAGNOSIS — J4521 Mild intermittent asthma with (acute) exacerbation: Secondary | ICD-10-CM | POA: Insufficient documentation

## 2021-09-10 DIAGNOSIS — F10129 Alcohol abuse with intoxication, unspecified: Secondary | ICD-10-CM | POA: Insufficient documentation

## 2021-09-10 DIAGNOSIS — Z5321 Procedure and treatment not carried out due to patient leaving prior to being seen by health care provider: Secondary | ICD-10-CM | POA: Insufficient documentation

## 2021-09-10 DIAGNOSIS — Z7951 Long term (current) use of inhaled steroids: Secondary | ICD-10-CM | POA: Diagnosis not present

## 2021-09-10 DIAGNOSIS — F10920 Alcohol use, unspecified with intoxication, uncomplicated: Secondary | ICD-10-CM

## 2021-09-10 LAB — CBC WITH DIFFERENTIAL/PLATELET
Abs Immature Granulocytes: 0.04 10*3/uL (ref 0.00–0.07)
Basophils Absolute: 0 10*3/uL (ref 0.0–0.1)
Basophils Relative: 0 %
Eosinophils Absolute: 0.2 10*3/uL (ref 0.0–0.5)
Eosinophils Relative: 2 %
HCT: 42.3 % (ref 39.0–52.0)
Hemoglobin: 14.7 g/dL (ref 13.0–17.0)
Immature Granulocytes: 0 %
Lymphocytes Relative: 23 %
Lymphs Abs: 2.3 10*3/uL (ref 0.7–4.0)
MCH: 29.9 pg (ref 26.0–34.0)
MCHC: 34.8 g/dL (ref 30.0–36.0)
MCV: 86 fL (ref 80.0–100.0)
Monocytes Absolute: 1.2 10*3/uL — ABNORMAL HIGH (ref 0.1–1.0)
Monocytes Relative: 12 %
Neutro Abs: 6.1 10*3/uL (ref 1.7–7.7)
Neutrophils Relative %: 63 %
Platelets: 292 10*3/uL (ref 150–400)
RBC: 4.92 MIL/uL (ref 4.22–5.81)
RDW: 13.2 % (ref 11.5–15.5)
WBC: 9.8 10*3/uL (ref 4.0–10.5)
nRBC: 0 % (ref 0.0–0.2)

## 2021-09-10 LAB — BASIC METABOLIC PANEL
Anion gap: 12 (ref 5–15)
BUN: 18 mg/dL (ref 6–20)
CO2: 22 mmol/L (ref 22–32)
Calcium: 8.7 mg/dL — ABNORMAL LOW (ref 8.9–10.3)
Chloride: 105 mmol/L (ref 98–111)
Creatinine, Ser: 0.95 mg/dL (ref 0.61–1.24)
GFR, Estimated: >60 mL/min (ref >60)
Glucose, Bld: 94 mg/dL (ref 70–99)
Potassium: 3.5 mmol/L (ref 3.5–5.1)
Sodium: 139 mmol/L (ref 135–145)

## 2021-09-10 NOTE — ED Triage Notes (Signed)
Pt reports heavy ETOH last night.  States, "I keep passing out and I'm anxious."  Denies pain.  Denies injury.

## 2021-09-10 NOTE — ED Notes (Addendum)
Pt walked to this tech and stated that he felt like he was going to pass out. Pt's vitals were rechecked and were stable. Pt was reassured that he was okay and that he needed to wait for a room.

## 2021-09-10 NOTE — ED Notes (Signed)
PT LWBS °

## 2021-09-10 NOTE — ED Provider Notes (Signed)
Emergency Medicine Provider Triage Evaluation Note  Kelly Patrick , a 25 y.o. male  was evaluated in triage.  Pt complains of drinking all day yesterday, passing out and then waking up this morning feeling anxious and as though he is going to pass out. Endorses some nausea. No chest pain, no SOB. History of anxiety, taking gabapentin.  Review of Systems  Positive: Anxiety, feeling lightheaded, nausea Negative: Chest pain, SOB  Physical Exam  BP 137/85 (BP Location: Left Arm)   Pulse 100   Temp (!) 97.3 F (36.3 C)   Resp 16   SpO2 100%  Gen:   Awake, no distress, anxious, not paying attention to exam, on phone throughout Resp:  Normal effort, and normal heart rate and rhythm on my exam MSK:   Moves extremities without difficulty  Other:  No TTP abdomen  Medical Decision Making  Medically screening exam initiated at 12:10 PM.  Appropriate orders placed.  Kelly Patrick was informed that the remainder of the evaluation will be completed by another provider, this initial triage assessment does not replace that evaluation, and the importance of remaining in the ED until their evaluation is complete.  Hungover, anxious, feeling like he's going to pass out   [place] 09/10/21 1212    Tegeler, Canary Brim, MD 09/10/21 [phone]

## 2021-09-10 NOTE — Discharge Instructions (Addendum)
Your blood work is normal.  No evidence of any kidney failure, electrolyte abnormalities. Hydrate well today and tomorrow.  Your symptoms could be because of heavy alcohol intoxication.

## 2021-09-10 NOTE — ED Provider Notes (Signed)
[facility] EMERGENCY DEPARTMENT Provider Note   CSN: [id number] Arrival date & time: 09/10/21  1157     History Chief Complaint  Patient presents with   Alcohol Intoxication   Loss of Consciousness    Kelly Patrick is a 25 y.o. male.  HPI     25 year old male comes in with chief complaint of dizziness and near fainting spell.  Patient reports that he was out celebrating last night and had heavy alcohol ingestion.  This morning, while at the gas station he felt significantly dizzy and was having palpitations.  He felt like he might faint.  He has been in the ED for few hours now and reports feeling a lot better.  He still wants to make sure that the blood that was drawn from him when he first arrived has resulted and looks normal.  Patient denies any cardiac disease history for him or family.  No sudden cardiac death for young people.  He denies any substance use yesterday.  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   Mild intermittent asthma with (acute) exacerbation 01/13/2019   EE (eosinophilic esophagitis) 01/13/2019   Seasonal allergic rhinitis due to pollen 01/13/2019   Nasal polyposis 01/13/2019   Pollen-food allergy 01/13/2019    Past Surgical History:  Procedure Laterality Date   GALLBLADDER SURGERY     TYMPANOSTOMY TUBE PLACEMENT         Family History  Problem Relation Age of Onset   Allergic rhinitis Mother    Angioedema Neg Hx    Asthma Neg Hx    Eczema Neg Hx    Immunodeficiency Neg Hx    Urticaria Neg Hx     Social History   Tobacco Use   Smoking status: Never   Smokeless tobacco: Never  Vaping Use   Vaping Use: Never used  Substance Use Topics   Alcohol use: Yes   Drug use: Never    Home Medications Prior to Admission medications    Medication Sig Start Date End Date Taking? Authorizing Provider  albuterol (PROVENTIL) (2.5 MG/3ML) 0.083% nebulizer solution USE 1 VIAL IN NEBULIZER EVERY 6 HOURS AS NEEDED FOR WHEEZING AND FOR SHORTNESS OF BREATH 01/13/21   Nehemiah Settle, FNP  albuterol (VENTOLIN HFA) 108 (90 Base) MCG/ACT inhaler INHALE 2 PUFFS BY MOUTH EVERY 4 HOURS AS NEEDED FOR WHEEZING OR COUGHING SPELLS 01/13/21   Nehemiah Settle, FNP  budesonide (PULMICORT FLEXHALER) 180 MCG/ACT inhaler Inhale 1 puff into the lungs 2 (two) times daily. 01/10/21   Nehemiah Settle, FNP  dextromethorphan-guaiFENesin [facility] DM) 30-600 MG 12hr tablet Take 1 tablet by mouth 2 (two) times daily. 08/20/19   Ellamae Sia, DO  EPINEPHrine 0.3 mg/0.3 mL IJ SOAJ injection Inject 0.3 mg into the muscle once. 01/12/19   [provider]  gabapentin (NEURONTIN) 100 MG capsule Take 200 mg by mouth 3 (three) times daily. 08/01/19   [provider]  ibuprofen (ADVIL) 800 MG tablet Take 800 mg by mouth 3 (three) times daily. 04/13/19   [provider]  loratadine (CLARITIN) 10 MG tablet Take by mouth.    [provider]  methocarbamol (ROBAXIN) 500 MG tablet Take 1 tablet (500 mg total) by mouth 2 (two) times daily. 06/30/20   Muthersbaugh, Dahlia Client, PA-C  naproxen (NAPROSYN)  500 MG tablet Take 1 tablet (500 mg total) by mouth 2 (two) times daily with a meal. 06/30/20   Muthersbaugh, Dahlia Client, PA-C    Allergies    Sudafed  [pseudoephedrine hcl], Cetirizine, Pseudoephedrine, and Diphenhydramine  Review of Systems   Review of Systems  Constitutional:  Positive for activity change.  Eyes:  Negative for visual disturbance.  Respiratory:  Negative for chest tightness and shortness of breath.   Cardiovascular:  Positive for palpitations. Negative for chest pain.  Neurological:  Negative for syncope.   Physical Exam Updated Vital Signs BP 135/86   Pulse 94   Temp 98 F (36.7 C)   Resp 20   SpO2 100%   Physical Exam Vitals and  nursing note reviewed.  Constitutional:      Appearance: He is well-developed.  HENT:     Head: Atraumatic.  Cardiovascular:     Rate and Rhythm: Normal rate.  Pulmonary:     Effort: Pulmonary effort is normal.  Musculoskeletal:     Cervical back: Neck supple.  Skin:    General: Skin is warm.  Neurological:     Mental Status: He is alert and oriented to person, place, and time.    ED Results / Procedures / Treatments   Labs (all labs ordered are listed, but only abnormal results are displayed) Labs Reviewed  BASIC METABOLIC PANEL - Abnormal; Notable for the following components:      Result Value   Calcium 8.7 (*)    All other components within normal limits  CBC WITH DIFFERENTIAL/PLATELET - Abnormal; Notable for the following components:   Monocytes Absolute 1.2 (*)    All other components within normal limits    EKG None  Radiology No results found.  Procedures Procedures   Medications Ordered in ED Medications - No data to display  ED Course  I have reviewed the triage vital signs and the nursing notes.  Pertinent labs & imaging results that were available during my care of the patient were reviewed by me and considered in my medical decision making (see chart for details).    MDM Rules/Calculators/A&P                          25 year old male comes in with chief complaint of near fainting, palpitations.  On exam, he has regular rate and rhythm and is asymptomatic.  Furthermore, he reports heavy alcohol ingestion earlier today.  Suspect that most likely he was dehydrated.  Holiday heart considered in the differential, but currently he is not in any kind of arrhythmia.  Basic labs were ordered, which are reassuring.  Stable for discharge  Final Clinical Impression(s) / ED Diagnoses Final diagnoses:  Dehydration  Alcoholic intoxication without complication [facility])    Rx / DC Orders ED Discharge Orders     None        Derwood Kaplan, MD 09/10/21  1558

## 2021-09-14 ENCOUNTER — Ambulatory Visit: Payer: Federal, State, Local not specified - PPO | Admitting: Internal Medicine

## 2021-11-28 ENCOUNTER — Other Ambulatory Visit: Payer: Self-pay

## 2021-11-28 ENCOUNTER — Ambulatory Visit: Payer: Federal, State, Local not specified - PPO | Admitting: Family Medicine

## 2021-11-28 ENCOUNTER — Encounter: Payer: Self-pay | Admitting: Family Medicine

## 2021-11-28 VITALS — BP 116/60 | HR 68 | Temp 98.3°F | Resp 12 | Ht 71.0 in | Wt 176.0 lb

## 2021-11-28 DIAGNOSIS — T7800XA Anaphylactic reaction due to unspecified food, initial encounter: Secondary | ICD-10-CM

## 2021-11-28 DIAGNOSIS — Z8379 Family history of other diseases of the digestive system: Secondary | ICD-10-CM | POA: Diagnosis not present

## 2021-11-28 DIAGNOSIS — J302 Other seasonal allergic rhinitis: Secondary | ICD-10-CM

## 2021-11-28 DIAGNOSIS — J3089 Other allergic rhinitis: Secondary | ICD-10-CM | POA: Diagnosis not present

## 2021-11-28 DIAGNOSIS — J4520 Mild intermittent asthma, uncomplicated: Secondary | ICD-10-CM

## 2021-11-28 DIAGNOSIS — T781XXD Other adverse food reactions, not elsewhere classified, subsequent encounter: Secondary | ICD-10-CM

## 2021-11-28 MED ORDER — ALBUTEROL SULFATE HFA 108 (90 BASE) MCG/ACT IN AERS: INHALATION_SPRAY | RESPIRATORY_TRACT | 1 refills | Status: AC

## 2021-11-28 MED ORDER — FLUTICASONE PROPIONATE HFA 110 MCG/ACT IN AERO: INHALATION_SPRAY | RESPIRATORY_TRACT | 5 refills | Status: AC

## 2021-11-28 MED ORDER — EPINEPHRINE 0.3 MG/0.3ML IJ SOAJ: 0.3000 mg | INTRAMUSCULAR | 1 refills | Status: DC | PRN

## 2021-11-28 NOTE — Patient Instructions (Addendum)
Asthma Continue albuterol 2 puffs every 4 hours as needed for cough, wheeze, tightness in chest, or shortness of breath.  For asthma flares start Flovent 110-2 puffs twice a day with a spacer for 2 weeks or until cough and wheeze free  Allergic rhinitis Your skin testing was positive to grass pollen, weed pollen, tree pollen, and mold.  Allergen avoidance measures are listed below Continue an over-the-counter antihistamine once a day as needed for runny nose or itch. Remember to rotate to a different antihistamine about every 3 months. Some examples of over the counter antihistamines include Zyrtec (cetirizine), Xyzal (levocetirizine), Allegra (fexofenadine), and Claritin (loratidine).  Continue Flonase 2 sprays in each nostril once a day as needed for a stuffy nose Consider saline nasal rinses as needed for nasal symptoms. Use this before any medicated nasal sprays for best result  Food allergy Your skin testing was positive to peanut, tree nuts, shellfish, onion, carrot, corn, cantaloupe, watermelon, and mustard. Continue to avoid tomato, potato, corn, rice, shellfish, peanut, tree nut, strawberry, orange, and apple. We have ordered blood testing to help manage your food allergies or intolerances  Oral allergy syndrome Continue to avoid foods that irritate your mouth  History of EOE Follow-up with your gastroenterologist for management of eosinophilic esophagitis and other gastric symptoms  Call the clinic if this treatment plan is not working well for you.  Follow up in 2 months or sooner if needed.  The oral allergy syndrome (OAS) or pollen-food allergy syndrome (PFAS) is a relatively common form of food allergy, particularly in adults. It typically occurs in people who have pollen allergies when the immune system "sees" proteins on the food that look like proteins on the pollen. This results in the allergy antibody (IgE) binding to the food instead of the pollen. Patients typically  report itching and/or mild swelling of the mouth and throat immediately following ingestion of certain uncooked fruits (including nuts) or raw vegetables. Only a very small number of affected individuals experience systemic allergic reactions, such as anaphylaxis which occurs with true food allergies.      Reducing Pollen Exposure The American Academy of Allergy, Asthma and Immunology suggests the following steps to reduce your exposure to pollen during allergy seasons. Do not hang sheets or clothing out to dry; pollen may collect on these items. Do not mow lawns or spend time around freshly cut grass; mowing stirs up pollen. Keep windows closed at night.  Keep car windows closed while driving. Minimize morning activities outdoors, a time when pollen counts are usually at their highest. Stay indoors as much as possible when pollen counts or humidity is high and on windy days when pollen tends to remain in the air longer. Use air conditioning when possible.  Many air conditioners have filters that trap the pollen spores. Use a HEPA room air filter to remove pollen form the indoor air you breathe.  Control of Mold Allergen Mold and fungi can grow on a variety of surfaces provided certain temperature and moisture conditions exist.  Outdoor molds grow on plants, decaying vegetation and soil.  The major outdoor mold, Alternaria and Cladosporium, are found in very high numbers during hot and dry conditions.  Generally, a late Summer - Fall peak is seen for common outdoor fungal spores.  Rain will temporarily lower outdoor mold spore count, but counts rise rapidly when the rainy period ends.  The most important indoor molds are Aspergillus and Penicillium.  Dark, humid and poorly ventilated basements are ideal sites  for mold growth.  The next most common sites of mold growth are the bathroom and the kitchen.  Outdoor [employer] Use air conditioning and keep windows closed Avoid exposure to decaying  vegetation. Avoid leaf raking. Avoid grain handling. Consider wearing a face mask if working in moldy areas.  Indoor Mold Control Maintain humidity below 50%. Clean washable surfaces with 5% bleach solution. Remove sources e.g. Contaminated carpets.  Marland Kitchen

## 2021-11-28 NOTE — Progress Notes (Signed)
[address] [place] [id number] Dept: [phone]  FOLLOW UP NOTE  Patient ID: Kelly BimlerCorey Newcom, male    DOB: [birth year]  Age: 26 y.o. MRN: [id number] Date of Office Visit: 11/28/2021  Assessment  Chief Complaint: Allergy Testing, Cough (Dry going on since christmas), and Nasal Congestion  HPI Kelly Patrick is a 26 year old male who presents to the clinic for follow-up visit.  He was last seen in this clinic on 01/10/2021 by Nehemiah Settlehristine Dale, FNP, for evaluation of asthma, allergic rhinitis, oral allergy syndrome, and possible EOE.He is accompanied by his mother who assists with history.  He reports his asthma has been moderately well controlled with dry cough beginning at Christmas time which occurs intermittently during the daytime only.  He reports a significant improvement of this cough over the last 3 weeks.  He denies shortness of breath and wheeze with activity and rest.  He has not used Pulmicort 180 or albuterol for the last 1 year.  Allergic rhinitis is reported as poorly controlled with symptoms including clear rhinorrhea, nasal congestion, occasional sneeze, and frequent post nasal drainage with throat clearing.  He reports this began in November and occurs every season.  He is not currently using an antihistamine, Sosie Gato nasal steroid spray, or nasal saline rinse.  He continues to avoid peanuts, tree nuts, tomato, potato, corn, rice, shellfish, and any pollen needed fruit including strawberries, oranges, and apples.  He reports that he was experiencing symptoms including headache, fatigue while he was eating these foods.  He reports a decrease in his symptoms since he has been avoiding these foods.  He last saw his GI specialist in 2020.  He reports reflux occurring once or twice a month and is not currently taking a medication to control reflux.  His current medications are listed in the chart.   Drug Allergies:  Allergies  Allergen Reactions   Sudafed  [Pseudoephedrine Hcl] Other (See  Comments)    High blood pressure    Cetirizine Other (See Comments)    Allergy test   Pseudoephedrine Other (See Comments)   Diphenhydramine     Other reaction(s): OTHER (explain in comments) agitation agitation agitation agitation agitation agitation agitation    Physical Exam: BP 116/60 (BP Location: Right Arm, Patient Position: Sitting, Cuff Size: Normal)    Pulse 68    Temp 98.3 F (36.8 C) (Temporal)    Resp 12    Ht 5\' 11"  (1.803 m)    Wt 176 lb (79.8 kg)    SpO2 97%    BMI 24.55 kg/m    Physical Exam Vitals reviewed.  Constitutional:      Appearance: Normal appearance.  HENT:     Head: Normocephalic and atraumatic.     Right Ear: Tympanic membrane normal.     Left Ear: Tympanic membrane normal.     Nose:     Comments: Bilateral nares edematous and pale with clear nasal drainage noted.  Pharynx normal.  Ears normal.  Eyes normal.    Mouth/Throat:     Pharynx: Oropharynx is clear.  Eyes:     Conjunctiva/sclera: Conjunctivae normal.  Cardiovascular:     Rate and Rhythm: Normal rate and regular rhythm.     Heart sounds: Normal heart sounds. No murmur heard. Pulmonary:     Effort: Pulmonary effort is normal.     Breath sounds: Normal breath sounds.     Comments: Lungs clear to auscultation Musculoskeletal:        General: Normal range of  motion.     Cervical back: Normal range of motion and neck supple.  Skin:    General: Skin is warm and dry.  Neurological:     Mental Status: He is alert and oriented to person, place, and time.  Psychiatric:        Mood and Affect: Mood normal.        Behavior: Behavior normal.        Thought Content: Thought content normal.        Judgment: Judgment normal.    Diagnostics: FVC 4.59, FEV1 3.50.  Predicted FVC 4.85, predicted FEV1 4.10.  Spirometry indicates normal ventilatory function.  Postbronchodilator FVC 5.01, FEV1 3.66.  Postbronchodilator spirometry indicates no significant improvement post bronchodilator  treatment.  Percutaneous environmental testing was positive to grass pollen, weed pollen, ragweed pollen, tree pollen, and molds with adequate controls.  Intradermal environmental testing was deferred due to patient choice.  Food allergy testing was positive to peanut, soybean, wheat, shellfish mix, almond, hazelnut, lobster, onion, carrot, corn, cantaloupe, watermelon, and mustard with adequate controls.   Assessment and Plan: 1. Mild intermittent asthma without complication   2. Seasonal and perennial allergic rhinitis   3. Allergy with anaphylaxis due to food   4. Pollen-food allergy, subsequent encounter   5. Family history of eosinophilic esophagitis     Meds ordered this encounter  Medications   fluticasone (FLOVENT HFA) 110 MCG/ACT inhaler    Sig: 2 puffs twice a day with a spacer for 2 weeks or until cough and wheeze free    Dispense:  1 each    Refill:  5   albuterol (VENTOLIN HFA) 108 (90 Base) MCG/ACT inhaler    Sig: 2 puffs every 4 hours as needed for cough, wheeze, tightness in chest, or shortness of breath.    Dispense:  8 g    Refill:  1   EPINEPHrine (EPIPEN 2-PAK) 0.3 mg/0.3 mL IJ SOAJ injection    Sig: Inject 0.3 mg into the muscle as needed for anaphylaxis.    Dispense:  1 each    Refill:  1    Patient Instructions  Asthma Continue albuterol 2 puffs every 4 hours as needed for cough, wheeze, tightness in chest, or shortness of breath.  For asthma flares start Flovent 110-2 puffs twice a day with a spacer for 2 weeks or until cough and wheeze free  Allergic rhinitis Your skin testing was positive to grass pollen, weed pollen, tree pollen, and mold.  Allergen avoidance measures are listed below Continue an over-the-counter antihistamine once a day as needed for runny nose or itch. Remember to rotate to a different antihistamine about every 3 months. Some examples of over the counter antihistamines include Zyrtec (cetirizine), Xyzal (levocetirizine), Allegra  (fexofenadine), and Claritin (loratidine).  Continue Flonase 2 sprays in each nostril once a day as needed for a stuffy nose Consider saline nasal rinses as needed for nasal symptoms. Use this before any medicated nasal sprays for best result  Food allergy Your skin testing was positive to peanut, tree nuts, shellfish, onion, carrot, corn, cantaloupe, watermelon, and mustard. Continue to avoid tomato, potato, corn, rice, shellfish, peanut, tree nut, strawberry, orange, and apple. We have ordered blood testing to help manage your food allergies or intolerances  Oral allergy syndrome Continue to avoid foods that irritate your mouth  History of EOE Follow-up with your gastroenterologist for management of eosinophilic esophagitis and other gastric symptoms  Return in about 2 months (around 01/26/2022), or if symptoms worsen  or fail to improve.   Thank you for the opportunity to care for this patient.  Please do not hesitate to contact me with questions.  Thermon Leyland, FNP [facility]

## 2021-12-03 LAB — IGE NUT PROF. W/COMPONENT RFLX
F017-IgE Hazelnut (Filbert): 43.6 kU/L — AB
F018-IgE Brazil Nut: <0.1 kU/L
F020-IgE Almond: 1.55 kU/L — AB
F202-IgE Cashew Nut: <0.1 kU/L
F203-IgE Pistachio Nut: 1.62 kU/L — AB
F256-IgE Walnut: 0.34 kU/L — AB
Macadamia Nut, IgE: 0.96 kU/L — AB
Peanut, IgE: 3.34 kU/L — AB
Pecan Nut IgE: <0.1 kU/L

## 2021-12-03 LAB — ALLERGENS(7)

## 2021-12-03 LAB — ALLERGEN PROFILE, SHELLFISH
Clam IgE: 0.28 kU/L — AB
F023-IgE Crab: 0.35 kU/L — AB
F080-IgE Lobster: 0.18 kU/L — AB
F290-IgE Oyster: 0.11 kU/L — AB
Scallop IgE: 0.29 kU/L — AB
Shrimp IgE: 0.32 kU/L — AB

## 2021-12-03 LAB — ALLERGEN, CORN F8: Allergen Corn, IgE: 1.47 kU/L — AB

## 2021-12-03 LAB — PANEL 604726
Cor A 1 IgE: 47.4 kU/L — AB
Cor A 14 IgE: <0.1 kU/L
Cor A 8 IgE: <0.1 kU/L
Cor A 9 IgE: <0.1 kU/L

## 2021-12-03 LAB — ALLERGEN, WHITE POTATO,F35: Allergen Potato, White IgE: 0.68 kU/L — AB

## 2021-12-03 LAB — PEANUT COMPONENTS
F352-IgE Ara h 8: 7.73 kU/L — AB
F422-IgE Ara h 1: <0.1 kU/L
F423-IgE Ara h 2: <0.1 kU/L
F424-IgE Ara h 3: <0.1 kU/L
F427-IgE Ara h 9: <0.1 kU/L
F447-IgE Ara h 6: <0.1 kU/L

## 2021-12-03 LAB — ALLERGEN, STRAWBERRY, F44: Allergen Strawberry IgE: 1.71 kU/L — AB

## 2021-12-03 LAB — PANEL 604721
Jug R 1 IgE: <0.1 kU/L
Jug R 3 IgE: <0.1 kU/L

## 2021-12-03 LAB — ALLERGEN, ORANGE F33: Orange: 0.37 kU/L — AB

## 2021-12-03 LAB — ALLERGEN, TOMATO F25: Allergen Tomato, IgE: 3.34 kU/L — AB

## 2021-12-03 LAB — ALLERGEN, APPLE F49: Allergen Apple, IgE: 8.39 kU/L — AB

## 2021-12-03 LAB — ALLERGEN COMPONENT COMMENTS

## 2021-12-03 LAB — ALLERGEN, RICE, F9: Allergen Rice IgE: 3.21 kU/L — AB

## 2021-12-04 ENCOUNTER — Encounter: Payer: Self-pay | Admitting: *Deleted

## 2021-12-04 ENCOUNTER — Telehealth: Payer: Self-pay | Admitting: Family Medicine

## 2021-12-04 ENCOUNTER — Other Ambulatory Visit: Payer: Self-pay | Admitting: *Deleted

## 2021-12-04 MED ORDER — EPINEPHRINE 0.3 MG/0.3ML IJ SOAJ: 0.3000 mg | INTRAMUSCULAR | 1 refills | Status: DC | PRN

## 2021-12-04 NOTE — Telephone Encounter (Signed)
Pt's mom and pt called to get results of testing

## 2021-12-04 NOTE — Telephone Encounter (Signed)
FYI

## 2021-12-04 NOTE — Progress Notes (Signed)
Can you please let this patient know that the blood work has returned. Please have him continue to avoid tree nuts, rice, tomato, apple, strawberry, soy, wheat, potato, corn, and peanut, shellfish and orange. There were some foods that were negative on skin testing and low on blood work that he could challenge in the clinic if he is interested including peanut, shellfish, potato, and corn. Please give him the written instructions of he wants to challenge any of these foods. Please make sure his epi pens are up to date. Thank you

## 2021-12-04 NOTE — Telephone Encounter (Signed)
Try calling pt back with lab results, and pt VM is not setup yet.

## 2021-12-04 NOTE — Progress Notes (Signed)
Can you please have him pick the food he would most like to get into his diet to start out with and that's the one we will challenge first. Thank you

## 2021-12-04 NOTE — Telephone Encounter (Signed)
Labs reviewed with parents. Lobster challenge scheduled and mailed info and copy of results.

## 2021-12-12 ENCOUNTER — Encounter: Payer: Federal, State, Local not specified - PPO | Admitting: Family Medicine

## 2021-12-12 ENCOUNTER — Other Ambulatory Visit: Payer: Self-pay

## 2021-12-12 MED ORDER — EPINEPHRINE 0.3 MG/0.3ML IJ SOAJ: 0.3000 mg | INTRAMUSCULAR | 1 refills | Status: AC | PRN

## 2021-12-12 NOTE — Progress Notes (Signed)
This encounter was created in error - please disregard.  This encounter was created in error - please disregard.

## 2022-01-05 ENCOUNTER — Other Ambulatory Visit: Payer: Self-pay

## 2022-01-05 ENCOUNTER — Emergency Department (HOSPITAL_COMMUNITY)
Admission: EM | Admit: 2022-01-05 | Discharge: 2022-01-05 | Disposition: A | Discharge status: Home / Self Care | Payer: Federal, State, Local not specified - PPO | Attending: Student | Admitting: Student

## 2022-01-05 DIAGNOSIS — Z5321 Procedure and treatment not carried out due to patient leaving prior to being seen by health care provider: Secondary | ICD-10-CM | POA: Insufficient documentation

## 2022-01-05 DIAGNOSIS — R002 Palpitations: Secondary | ICD-10-CM | POA: Insufficient documentation

## 2022-01-05 DIAGNOSIS — R42 Dizziness and giddiness: Secondary | ICD-10-CM | POA: Insufficient documentation

## 2022-01-05 DIAGNOSIS — R079 Chest pain, unspecified: Secondary | ICD-10-CM | POA: Insufficient documentation

## 2022-01-05 LAB — CBC WITH DIFFERENTIAL/PLATELET
Abs Immature Granulocytes: 0.02 10*3/uL (ref 0.00–0.07)
Basophils Absolute: 0 10*3/uL (ref 0.0–0.1)
Basophils Relative: 1 %
Eosinophils Absolute: 0.2 10*3/uL (ref 0.0–0.5)
Eosinophils Relative: 3 %
HCT: 41.2 % (ref 39.0–52.0)
Hemoglobin: 13.7 g/dL (ref 13.0–17.0)
Immature Granulocytes: 0 %
Lymphocytes Relative: 19 %
Lymphs Abs: 1.4 10*3/uL (ref 0.7–4.0)
MCH: 29.2 pg (ref 26.0–34.0)
MCHC: 33.3 g/dL (ref 30.0–36.0)
MCV: 87.8 fL (ref 80.0–100.0)
Monocytes Absolute: 0.7 10*3/uL (ref 0.1–1.0)
Monocytes Relative: 9 %
Neutro Abs: 5.1 10*3/uL (ref 1.7–7.7)
Neutrophils Relative %: 68 %
Platelets: 241 10*3/uL (ref 150–400)
RBC: 4.69 MIL/uL (ref 4.22–5.81)
RDW: 12.6 % (ref 11.5–15.5)
WBC: 7.5 10*3/uL (ref 4.0–10.5)
nRBC: 0 % (ref 0.0–0.2)

## 2022-01-05 LAB — BASIC METABOLIC PANEL
Anion gap: 10 (ref 5–15)
BUN: 10 mg/dL (ref 6–20)
CO2: 26 mmol/L (ref 22–32)
Calcium: 9 mg/dL (ref 8.9–10.3)
Chloride: 103 mmol/L (ref 98–111)
Creatinine, Ser: 1 mg/dL (ref 0.61–1.24)
GFR, Estimated: >60 mL/min (ref >60)
Glucose, Bld: 89 mg/dL (ref 70–99)
Potassium: 4.1 mmol/L (ref 3.5–5.1)
Sodium: 139 mmol/L (ref 135–145)

## 2022-01-05 LAB — TROPONIN I (HIGH SENSITIVITY): Troponin I (High Sensitivity): 5 ng/L (ref <18)

## 2022-01-05 NOTE — ED Triage Notes (Addendum)
Pt here d/t  chest pain, and palpitations. Pt was laying down and felt light headed. Episode lasted aprox 15 mins. No distress noted in triage. Pt states he was drinking last night and feels hungover.

## 2022-01-05 NOTE — ED Provider Triage Note (Signed)
Emergency Medicine Provider Triage Evaluation Note  Tzion Wedel , a 26 y.o. male  was evaluated in triage.  Pt complains of palpitations, lightheadedness, and chest pain.  Patient reports that approximate 1 hour prior he was laying in bed when he had a sudden onset of palpitations, chest tightness, and lightheadedness.  The symptoms lasted for approximately 15 minutes before resolving.  Patient denies any illicit drug use.  Had alcohol use yesterday.    Review of Systems  Positive: Palpitations, lightheadedness, chest pain Negative: Nausea, vomiting, diaphoresis, shortness of breath, syncope, leg swelling or tenderness, wheezing  Physical Exam  BP (!) 145/81 (BP Location: Right Arm)    Pulse 79    Temp 98.6 F (37 C) (Oral)    Resp 16    SpO2 98%  Gen:   Awake, no distress   Resp:  Normal effort, to auscultation bilaterally MSK:   Moves extremities without difficulty  Other:  +2 radial pulse bilaterally.  S1, S2 present with no murmurs rubs or gallops.  No swelling or tenderness to bilateral lower extremities  Medical Decision Making  Medically screening exam initiated at 12:34 PM.  Appropriate orders placed.  Artis Beggs was informed that the remainder of the evaluation will be completed by another provider, this initial triage assessment does not replace that evaluation, and the importance of remaining in the ED until their evaluation is complete.     Haskel Schroeder, [place] 01/05/22 1235

## 2022-02-24 ENCOUNTER — Other Ambulatory Visit: Payer: Self-pay

## 2022-02-24 ENCOUNTER — Emergency Department (HOSPITAL_COMMUNITY)
Admission: EM | Admit: 2022-02-24 | Discharge: 2022-02-24 | Disposition: A | Discharge status: Home / Self Care | Payer: Federal, State, Local not specified - PPO | Attending: Emergency Medicine | Admitting: Emergency Medicine

## 2022-02-24 DIAGNOSIS — Z7951 Long term (current) use of inhaled steroids: Secondary | ICD-10-CM | POA: Diagnosis not present

## 2022-02-24 DIAGNOSIS — T65894A Toxic effect of other specified substances, undetermined, initial encounter: Secondary | ICD-10-CM | POA: Diagnosis not present

## 2022-02-24 DIAGNOSIS — J4521 Mild intermittent asthma with (acute) exacerbation: Secondary | ICD-10-CM | POA: Insufficient documentation

## 2022-02-24 DIAGNOSIS — K521 Toxic gastroenteritis and colitis: Secondary | ICD-10-CM | POA: Insufficient documentation

## 2022-02-24 DIAGNOSIS — R195 Other fecal abnormalities: Secondary | ICD-10-CM | POA: Diagnosis present

## 2022-02-24 MED ORDER — DICYCLOMINE HCL 20 MG PO TABS: 20.0000 mg | ORAL_TABLET | Freq: Two times a day (BID) | ORAL | 0 refills | Status: DC

## 2022-02-24 MED ORDER — ONDANSETRON 4 MG PO TBDP: 4.0000 mg | ORAL_TABLET | Freq: Three times a day (TID) | ORAL | 0 refills | Status: AC | PRN

## 2022-02-24 NOTE — ED Provider Notes (Signed)
?[facility]-EMERGENCY DEPT ?Provider Note ? ? ?CSN: [id number] ?Arrival date & time: 02/24/22  1509 ? ?  ? ?History ? ?Chief Complaint  ?Patient presents with  ? Allergic Reaction  ? ? ?Kelly Patrick is a 26 y.o. male. ? ? ?Allergic Reaction ?Patient is a 26 year old male presented emergency room today with chief complaint of he states he has had a less than 5 episodes of stool per day with no fevers lightheadedness dizziness.  He states that he does feel somewhat fatigued but not severely so he has been able to tolerate p.o. has not had no episodes of vomiting.  He states that his symptoms began approximate 24 hours after he started taking amoxicillin as well as doxycycline for strep throat and doxycycline for chlamydia ? ? ?  ? ?Home Medications ?Prior to Admission medications   ?Medication Sig Start Date End Date Taking? Authorizing Provider  ?dicyclomine (BENTYL) 20 MG tablet Take 1 tablet (20 mg total) by mouth 2 (two) times daily. 02/24/22  Yes Rashawn Rayman, Stevphen Meuse S, PA  ?ondansetron (ZOFRAN-ODT) 4 MG disintegrating tablet Take 1 tablet (4 mg total) by mouth every 8 (eight) hours as needed for nausea or vomiting. 02/24/22  Yes Marena Witts S, PA  ?albuterol (PROVENTIL) (2.5 MG/3ML) 0.083% nebulizer solution USE 1 VIAL IN NEBULIZER EVERY 6 HOURS AS NEEDED FOR WHEEZING AND FOR SHORTNESS OF BREATH ?Patient not taking: Reported on 11/28/2021 01/13/21   Nehemiah Settle, FNP  ?albuterol (VENTOLIN HFA) 108 (90 Base) MCG/ACT inhaler INHALE 2 PUFFS BY MOUTH EVERY 4 HOURS AS NEEDED FOR WHEEZING OR COUGHING SPELLS ?Patient not taking: Reported on 11/28/2021 01/13/21   Nehemiah Settle, FNP  ?albuterol (VENTOLIN HFA) 108 (90 Base) MCG/ACT inhaler 2 puffs every 4 hours as needed for cough, wheeze, tightness in chest, or shortness of breath. 11/28/21   Hetty Blend, FNP  ?dextromethorphan-guaiFENesin (MUCINEX DM) 30-600 MG 12hr tablet Take 1 tablet by mouth 2 (two) times daily. ?Patient not taking: Reported on  11/28/2021 08/20/19   Ellamae Sia, DO  ?EPINEPHrine (EPIPEN 2-PAK) 0.3 mg/0.3 mL IJ SOAJ injection Inject 0.3 mg into the muscle as needed for anaphylaxis. 12/12/21   Hetty Blend, FNP  ?EPINEPHrine 0.3 mg/0.3 mL IJ SOAJ injection Inject 0.3 mg into the muscle once. ?Patient not taking: Reported on 11/28/2021 01/12/19   [provider]  ?fluticasone (FLOVENT HFA) 110 MCG/ACT inhaler 2 puffs twice a day with a spacer for 2 weeks or until cough and wheeze free 11/28/21   Ambs, Norvel Richards, FNP  ?gabapentin (NEURONTIN) 100 MG capsule Take 200 mg by mouth 2 (two) times daily. 08/01/19   [provider]  ?loratadine (CLARITIN) 10 MG tablet Take by mouth. ?Patient not taking: Reported on 11/28/2021    [provider]  ?   ? ?Allergies    ?Sudafed  [pseudoephedrine hcl], Cetirizine, Pseudoephedrine, and Diphenhydramine   ? ?Review of Systems   ?Review of Systems ? ?Physical Exam ?Updated Vital Signs ?BP (!) 142/91 (BP Location: Right Arm)   Pulse 77   Temp 98.3 ?F (36.8 ?C) (Oral)   Resp 16   SpO2 99%  ?Physical Exam ?Vitals and nursing note reviewed.  ?Constitutional:   ?   General: He is not in acute distress. ?   Comments: Pleasant well-appearing 26 year old.  In no acute distress.  Sitting comfortably in bed.  Able answer questions appropriately follow commands. No increased work of breathing. Speaking in full sentences.   ?HENT:  ?   Head:  Normocephalic and atraumatic.  ?   Nose: Nose normal.  ?   Mouth/Throat:  ?   Mouth: Mucous membranes are moist.  ?Eyes:  ?   General: No scleral icterus. ?Cardiovascular:  ?   Rate and Rhythm: Normal rate and regular rhythm.  ?   Pulses: Normal pulses.  ?   Heart sounds: Normal heart sounds.  ?Pulmonary:  ?   Effort: Pulmonary effort is normal. No respiratory distress.  ?   Breath sounds: No wheezing.  ?Abdominal:  ?   Palpations: Abdomen is soft.  ?   Tenderness: There is no abdominal tenderness. There is no right CVA tenderness, left CVA tenderness, guarding or  rebound.  ?   Comments: Abdomen soft nontender.  No guarding or rebound.  There is no tenderness to soft/light or deep palpation.    ?Musculoskeletal:  ?   Cervical back: Normal range of motion.  ?   Right lower leg: No edema.  ?   Left lower leg: No edema.  ?Skin: ?   General: Skin is warm and dry.  ?   Capillary Refill: Capillary refill takes less than 2 seconds.  ?Neurological:  ?   Mental Status: He is alert. Mental status is at baseline.  ?   Comments: Patient is ambulating without difficulty, moist oral mucosa.  Does not appear dehydrated  ?Psychiatric:     ?   Mood and Affect: Mood normal.     ?   Behavior: Behavior normal.  ? ? ?ED Results / Procedures / Treatments   ?Labs ?(all labs ordered are listed, but only abnormal results are displayed) ?Labs Reviewed - No data to display ? ?EKG ?None ? ?Radiology ?No results found. ? ?Procedures ?Procedures  ? ? ?Medications Ordered in ED ?Medications - No data to display ? ?ED Course/ Medical Decision Making/ A&P ?  ?                        ?Medical Decision Making ?Risk ?Prescription drug management. ? ? ?Patient is a 26 year old male presented emergency room today with chief complaint of he states he has had a less than 5 episodes of stool per day with no fevers lightheadedness dizziness.  He states that he does feel somewhat fatigued but not severely so he has been able to tolerate p.o. has not had no episodes of vomiting.  He states that his symptoms began approximate 24 hours after he started taking amoxicillin as well as doxycycline for strep throat and doxycycline for chlamydia ? ?Physical exam reassuring. ? ?Infection, appendicitis, UTI, infection. ? ?I had a shared decision conversation after patient labs including CMP, CBC, lipase versus empiric treatment with Zofran and Bentyl and follow-up emergency room should symptoms worsen.  Recommend he eat and take a dose of Zofran 30 minutes taking his antibiotics.  He states he would prefer the latter  option. ? ?I think this is a reasonable option.  Will discharge home with Zofran and Bentyl he understands the importance of monitoring symptoms and return to the ER symptoms persist or worsen ? ? ?Final Clinical Impression(s) / ED Diagnoses ?Final diagnoses:  ?Antibiotic-associated diarrhea  ? ? ?Rx / DC Orders ?ED Discharge Orders   ? ?      Ordered  ?  ondansetron (ZOFRAN-ODT) 4 MG disintegrating tablet  Every 8 hours PRN       ? 02/24/22 1603  ?  dicyclomine (BENTYL) 20 MG tablet  2 times daily       ?  02/24/22 1603  ? ?  ?  ? ?  ? ? ?  ?Gailen ShelterFondaw, Dove Gresham S, [place] ?02/24/22 1645 ? ?  ?Lorre NickAllen, Anthony, MD ?02/25/22 1557 ? ?

## 2022-02-24 NOTE — Discharge Instructions (Addendum)
As we discussed I recommend using Zofran 30 minutes prior to taking the antibiotics.  I recommend specifically taking Zofran, eating and then waiting approximately 30 minutes that way your stomach is not completely full.  I also recommend bland foods for the most part such as rice cakes, bananas, applesauce, toast, avocados ? ?Make sure you are drinking Gatorade, Pedialyte, water noted to hydrate. ? ?[phone] ?

## 2022-02-24 NOTE — ED Triage Notes (Signed)
Pt presents after being on anbx for 2 days. Reports stomach upset and diarrhea. Has had decreased appetite since taking. Did not eat today prior to taking anbx.   ?

## 2022-03-12 ENCOUNTER — Emergency Department (HOSPITAL_COMMUNITY)
Admission: EM | Admit: 2022-03-12 | Discharge: 2022-03-12 | Disposition: A | Discharge status: Home / Self Care | Payer: Federal, State, Local not specified - PPO | Attending: Emergency Medicine | Admitting: Emergency Medicine

## 2022-03-12 ENCOUNTER — Other Ambulatory Visit: Payer: Self-pay

## 2022-03-12 ENCOUNTER — Encounter (HOSPITAL_COMMUNITY): Payer: Self-pay | Admitting: Emergency Medicine

## 2022-03-12 ENCOUNTER — Emergency Department (HOSPITAL_COMMUNITY): Payer: Federal, State, Local not specified - PPO

## 2022-03-12 DIAGNOSIS — R072 Precordial pain: Secondary | ICD-10-CM | POA: Insufficient documentation

## 2022-03-12 DIAGNOSIS — R079 Chest pain, unspecified: Secondary | ICD-10-CM | POA: Diagnosis present

## 2022-03-12 DIAGNOSIS — R002 Palpitations: Secondary | ICD-10-CM | POA: Diagnosis not present

## 2022-03-12 LAB — CBC
HCT: 43.3 % (ref 39.0–52.0)
Hemoglobin: 15.2 g/dL (ref 13.0–17.0)
MCH: 30 pg (ref 26.0–34.0)
MCHC: 35.1 g/dL (ref 30.0–36.0)
MCV: 85.4 fL (ref 80.0–100.0)
Platelets: 236 10*3/uL (ref 150–400)
RBC: 5.07 MIL/uL (ref 4.22–5.81)
RDW: 12.2 % (ref 11.5–15.5)
WBC: 6.6 10*3/uL (ref 4.0–10.5)
nRBC: 0 % (ref 0.0–0.2)

## 2022-03-12 LAB — BASIC METABOLIC PANEL
Anion gap: 12 (ref 5–15)
BUN: 12 mg/dL (ref 6–20)
CO2: 26 mmol/L (ref 22–32)
Calcium: 8.9 mg/dL (ref 8.9–10.3)
Chloride: 99 mmol/L (ref 98–111)
Creatinine, Ser: 1.15 mg/dL (ref 0.61–1.24)
GFR, Estimated: >60 mL/min (ref >60)
Glucose, Bld: 109 mg/dL — ABNORMAL HIGH (ref 70–99)
Potassium: 3.8 mmol/L (ref 3.5–5.1)
Sodium: 137 mmol/L (ref 135–145)

## 2022-03-12 LAB — TROPONIN I (HIGH SENSITIVITY)
Troponin I (High Sensitivity): 6 ng/L (ref <18)
Troponin I (High Sensitivity): 7 ng/L (ref <18)

## 2022-03-12 MED ORDER — FAMOTIDINE 20 MG PO TABS: 20.0000 mg | ORAL_TABLET | Freq: Two times a day (BID) | ORAL | 0 refills | Status: AC

## 2022-03-12 NOTE — ED Notes (Signed)
Pts mother requesting updates, requested that pt call mother back to update her.  ?

## 2022-03-12 NOTE — Discharge Instructions (Signed)
Please read and follow all provided instructions. ? ?Your diagnoses today include:  ?1. Precordial pain   ? ? ?Tests performed today include: ?An EKG of your heart ?A chest x-ray ?Cardiac enzymes - a blood test for heart muscle damage ?Blood counts and electrolytes ?Vital signs. See below for your results today.  ? ?Medications prescribed:  ?Pepcid (famotidine) - antihistamine ? ?You can find this medication over-the-counter.  ? ?DO NOT exceed:  ?20mg  Pepcid every 12 hours ? ?Take any prescribed medications only as directed. ? ?Follow-up instructions: ?Please follow-up with your primary care provider as soon as you can for further evaluation of your symptoms.  ? ?Return instructions:  ?SEEK IMMEDIATE MEDICAL ATTENTION IF: ?You have severe chest pain, especially if the pain is crushing or pressure-like and spreads to the arms, back, neck, or jaw, or if you have sweating, nausea or vomiting, or trouble with breathing. THIS IS AN EMERGENCY. Do not wait to see if the pain will go away. Get medical help at once. Call 911. DO NOT drive yourself to the hospital.  ?Your chest pain gets worse and does not go away after a few minutes of rest.  ?You have an attack of chest pain lasting longer than what you usually experience.  ?You have significant dizziness, if you pass out, or have trouble walking.  ?You have chest pain not typical of your usual pain for which you originally saw your caregiver.  ?You have any other emergent concerns regarding your health. ? ?Additional Information: ?Chest pain comes from many different causes. Your caregiver has diagnosed you as having chest pain that is not specific for one problem, but does not require admission.  You are at low risk for an acute heart condition or other serious illness.  ? ?Your vital signs today were: ?BP 113/65   Pulse 67   Temp 98.3 ?F (36.8 ?C) (Oral)   Resp 15   Ht 6' (1.829 m)   Wt 79.4 kg   SpO2 96%   BMI 23.73 kg/m?  ?If your blood pressure (BP) was  elevated above 135/85 this visit, please have this repeated by your doctor within one month. ?-------------- ? ? ?

## 2022-03-12 NOTE — ED Provider Notes (Signed)
?[facility] EMERGENCY DEPARTMENT ?Provider Note ? ? ?CSN: [id number] ?Arrival date & time: 03/12/22  4098 ? ?  ? ?History ? ?Chief Complaint  ?Patient presents with  ? Chest Pain  ? ? ?Kelly Patrick is a 26 y.o. male. ? ?Patient presents to the emergency department today for evaluation of left-sided chest pain and palpitations.  Symptoms started about 90 minutes ago.  It started after he took shots of alcohol.  He denies other drugs or stimulants including cocaine.  No lightheadedness or syncope.  No associated vomiting or diaphoresis.  No abdominal pains.  Chest pain does not radiate.  He states that his heart feels like it is having episodes of racing.  No treatments prior to arrival.  Denies history of hypertension, high cholesterol, diabetes.  States that his father is on some heart medication but denies family history of heart attacks that he knows of. ? ? ?  ? ?Home Medications ?Prior to Admission medications   ?Medication Sig Start Date End Date Taking? Authorizing Provider  ?albuterol (PROVENTIL) (2.5 MG/3ML) 0.083% nebulizer solution USE 1 VIAL IN NEBULIZER EVERY 6 HOURS AS NEEDED FOR WHEEZING AND FOR SHORTNESS OF BREATH ?Patient not taking: Reported on 11/28/2021 01/13/21   Nehemiah Settle, FNP  ?albuterol (VENTOLIN HFA) 108 (90 Base) MCG/ACT inhaler INHALE 2 PUFFS BY MOUTH EVERY 4 HOURS AS NEEDED FOR WHEEZING OR COUGHING SPELLS ?Patient not taking: Reported on 11/28/2021 01/13/21   Nehemiah Settle, FNP  ?albuterol (VENTOLIN HFA) 108 (90 Base) MCG/ACT inhaler 2 puffs every 4 hours as needed for cough, wheeze, tightness in chest, or shortness of breath. 11/28/21   Hetty Blend, FNP  ?dextromethorphan-guaiFENesin (MUCINEX DM) 30-600 MG 12hr tablet Take 1 tablet by mouth 2 (two) times daily. ?Patient not taking: Reported on 11/28/2021 08/20/19   Ellamae Sia, DO  ?dicyclomine (BENTYL) 20 MG tablet Take 1 tablet (20 mg total) by mouth 2 (two) times daily. 02/24/22   Gailen Shelter, PA  ?EPINEPHrine  (EPIPEN 2-PAK) 0.3 mg/0.3 mL IJ SOAJ injection Inject 0.3 mg into the muscle as needed for anaphylaxis. 12/12/21   Hetty Blend, FNP  ?EPINEPHrine 0.3 mg/0.3 mL IJ SOAJ injection Inject 0.3 mg into the muscle once. ?Patient not taking: Reported on 11/28/2021 01/12/19   [provider]  ?fluticasone (FLOVENT HFA) 110 MCG/ACT inhaler 2 puffs twice a day with a spacer for 2 weeks or until cough and wheeze free 11/28/21   Ambs, Norvel Richards, FNP  ?gabapentin (NEURONTIN) 100 MG capsule Take 200 mg by mouth 2 (two) times daily. 08/01/19   [provider]  ?loratadine (CLARITIN) 10 MG tablet Take by mouth. ?Patient not taking: Reported on 11/28/2021    [provider]  ?ondansetron (ZOFRAN-ODT) 4 MG disintegrating tablet Take 1 tablet (4 mg total) by mouth every 8 (eight) hours as needed for nausea or vomiting. 02/24/22   Gailen Shelter, PA  ?   ? ?Allergies    ?Sudafed  [pseudoephedrine hcl], Cetirizine, Pseudoephedrine, and Diphenhydramine   ? ?Review of Systems   ?Review of Systems ? ?Physical Exam ?Updated Vital Signs ?BP 130/80 (BP Location: Left Arm)   Pulse 86   Temp 98.3 ?F (36.8 ?C) (Oral)   Resp 15   Ht 6' (1.829 m)   Wt 79.4 kg   SpO2 99%   BMI 23.73 kg/m?  ? ?Physical Exam ?Vitals and nursing note reviewed.  ?Constitutional:   ?   Appearance: He is well-developed. He is not diaphoretic.  ?  HENT:  ?   Head: Normocephalic and atraumatic.  ?   Mouth/Throat:  ?   Mouth: Mucous membranes are not dry.  ?Eyes:  ?   Conjunctiva/sclera: Conjunctivae normal.  ?Neck:  ?   Vascular: Normal carotid pulses. No JVD.  ?   Trachea: Trachea normal. No tracheal deviation.  ?Cardiovascular:  ?   Rate and Rhythm: Normal rate and regular rhythm.  ?   Pulses: No decreased pulses.     ?     Radial pulses are 2+ on the right side and 2+ on the left side.  ?   Heart sounds: Normal heart sounds, S1 normal and S2 normal. Heart sounds not distant. No murmur heard. ?Pulmonary:  ?   Effort: Pulmonary effort is normal. No  respiratory distress.  ?   Breath sounds: Normal breath sounds. No wheezing.  ?Chest:  ?   Chest wall: No tenderness.  ?Abdominal:  ?   General: Bowel sounds are normal.  ?   Palpations: Abdomen is soft.  ?   Tenderness: There is no abdominal tenderness. There is no guarding or rebound.  ?Musculoskeletal:  ?   Cervical back: Normal range of motion and neck supple. No muscular tenderness.  ?   Right lower leg: No edema.  ?   Left lower leg: No edema.  ?Skin: ?   General: Skin is warm and dry.  ?   Coloration: Skin is not pale.  ?Neurological:  ?   Mental Status: He is alert. Mental status is at baseline.  ?Psychiatric:     ?   Mood and Affect: Mood is anxious.  ? ? ?ED Results / Procedures / Treatments   ?Labs ?(all labs ordered are listed, but only abnormal results are displayed) ?Labs Reviewed  ?BASIC METABOLIC PANEL - Abnormal; Notable for the following components:  ?    Result Value  ? Glucose, Bld 109 (*)   ? All other components within normal limits  ?CBC  ?TROPONIN I (HIGH SENSITIVITY)  ?TROPONIN I (HIGH SENSITIVITY)  ? ? ?ED ECG REPORT ? ? Date: 03/12/2022 ? Rate: 81 ? Rhythm: normal sinus rhythm ? QRS Axis: normal ? Intervals: normal ? ST/T Wave abnormalities: nonspecific T wave changes ? Conduction Disutrbances:none ? Narrative Interpretation: similar to 12/2021 with TWI III ? Old EKG Reviewed: unchanged ? ?I have personally reviewed the EKG tracing and agree with the computerized printout as noted. ? ?Radiology ?DG Chest 2 View ? ?Result Date: 03/12/2022 ?CLINICAL DATA:  Left-sided chest pain for 1 hour EXAM: CHEST - 2 VIEW COMPARISON:  08/24/2019 FINDINGS: Normal heart size and mediastinal contours. No acute infiltrate or edema. No effusion or pneumothorax. No acute osseous findings. IMPRESSION: Negative chest. Electronically Signed   By: Tiburcio Pea M.D.   On: 03/12/2022 07:28   ? ?Procedures ?Procedures  ? ? ?Medications Ordered in ED ?Medications - No data to display ? ?ED Course/ Medical Decision  Making/ A&P ?  ? ?Patient seen and examined. History obtained directly from patient.  ? ?Labs/EKG: Ordered CBC, BMP, troponin.  EKG personally reviewed and interpreted. ? ?Imaging: Chest x-ray images personally viewed and interpreted.  Agree negative. ? ?Medications/Fluids: None ordered. ? ?Most recent vital signs reviewed and are as follows: ?BP 130/80 (BP Location: Left Arm)   Pulse 86   Temp 98.3 ?F (36.8 ?C) (Oral)   Resp 15   Ht 6' (1.829 m)   Wt 79.4 kg   SpO2 99%   BMI 23.73 kg/m?  ? ?Initial  impression: Atypical chest pain, palpitations. ? ?10:31 AM Reassessment performed. Patient appears comfortable.  ? ?Labs personally reviewed and interpreted including: CBC unremarkable; BMP glucose 109 otherwise unremarkable; troponin 6 >> 7.  ? ?Reviewed pertinent lab work and imaging with patient at bedside. Questions answered.  ? ?Most current vital signs reviewed and are as follows: ?BP 113/65   Pulse 67   Temp 98.3 ?F (36.8 ?C) (Oral)   Resp 15   Ht 6' (1.829 m)   Wt 79.4 kg   SpO2 96%   BMI 23.73 kg/m?  ? ?Plan: Discharge to home.  ? ?Prescriptions written for: Pepcid ? ?Other home care instructions discussed: Avoidance of alcohol, NSAIDs, bland diet ? ?ED return instructions discussed: Return and follow-up instructions: I encouraged patient to return to ED with severe chest pain, especially if the pain is crushing or pressure-like and spreads to the arms, back, neck, or jaw, or if they have associated sweating, vomiting, or shortness of breath with the pain, or significant pain with activity. We discussed that the evaluation here today indicates a low-risk of serious cause of chest pain, including heart trouble or a blood clot, but no evaluation is perfect and chest pain can evolve with time. The patient verbalized understanding and agreed.  I encouraged patient to follow-up with their provider in the next 48 hours for recheck.   ? ? ?Follow-up instructions discussed: Patient encouraged to follow-up  with their PCP in 7 days as needed.  ? ? ? ? ? ?                        ?Medical Decision Making ?Amount and/or Complexity of Data Reviewed ?Labs: ordered. ?Radiology: ordered. ? ? ?For this patient's complaint of chest pain, th

## 2022-03-12 NOTE — ED Triage Notes (Signed)
Pt c/o left sided non-radiating chest pain x 1 hour ago after drinking liquor; denies all other symptoms  ?

## 2022-03-12 NOTE — ED Notes (Signed)
Patient verbalizes understanding of discharge instructions. Opportunity for questioning and answers were provided. Armband removed by staff, pt discharged from ED. Pt ambulatory to ED waiting room. 

## 2022-03-12 NOTE — ED Notes (Signed)
Pt ambulatory to bathroom

## 2022-08-17 IMAGING — CR DG CHEST 2V
2 series · 2 of 2 positions shown · non-contrast
Comparison: 08/24/2019

CLINICAL DATA: Left-sided chest pain for 1 hour

EXAM:
CHEST - 2 VIEW

[chest pa]
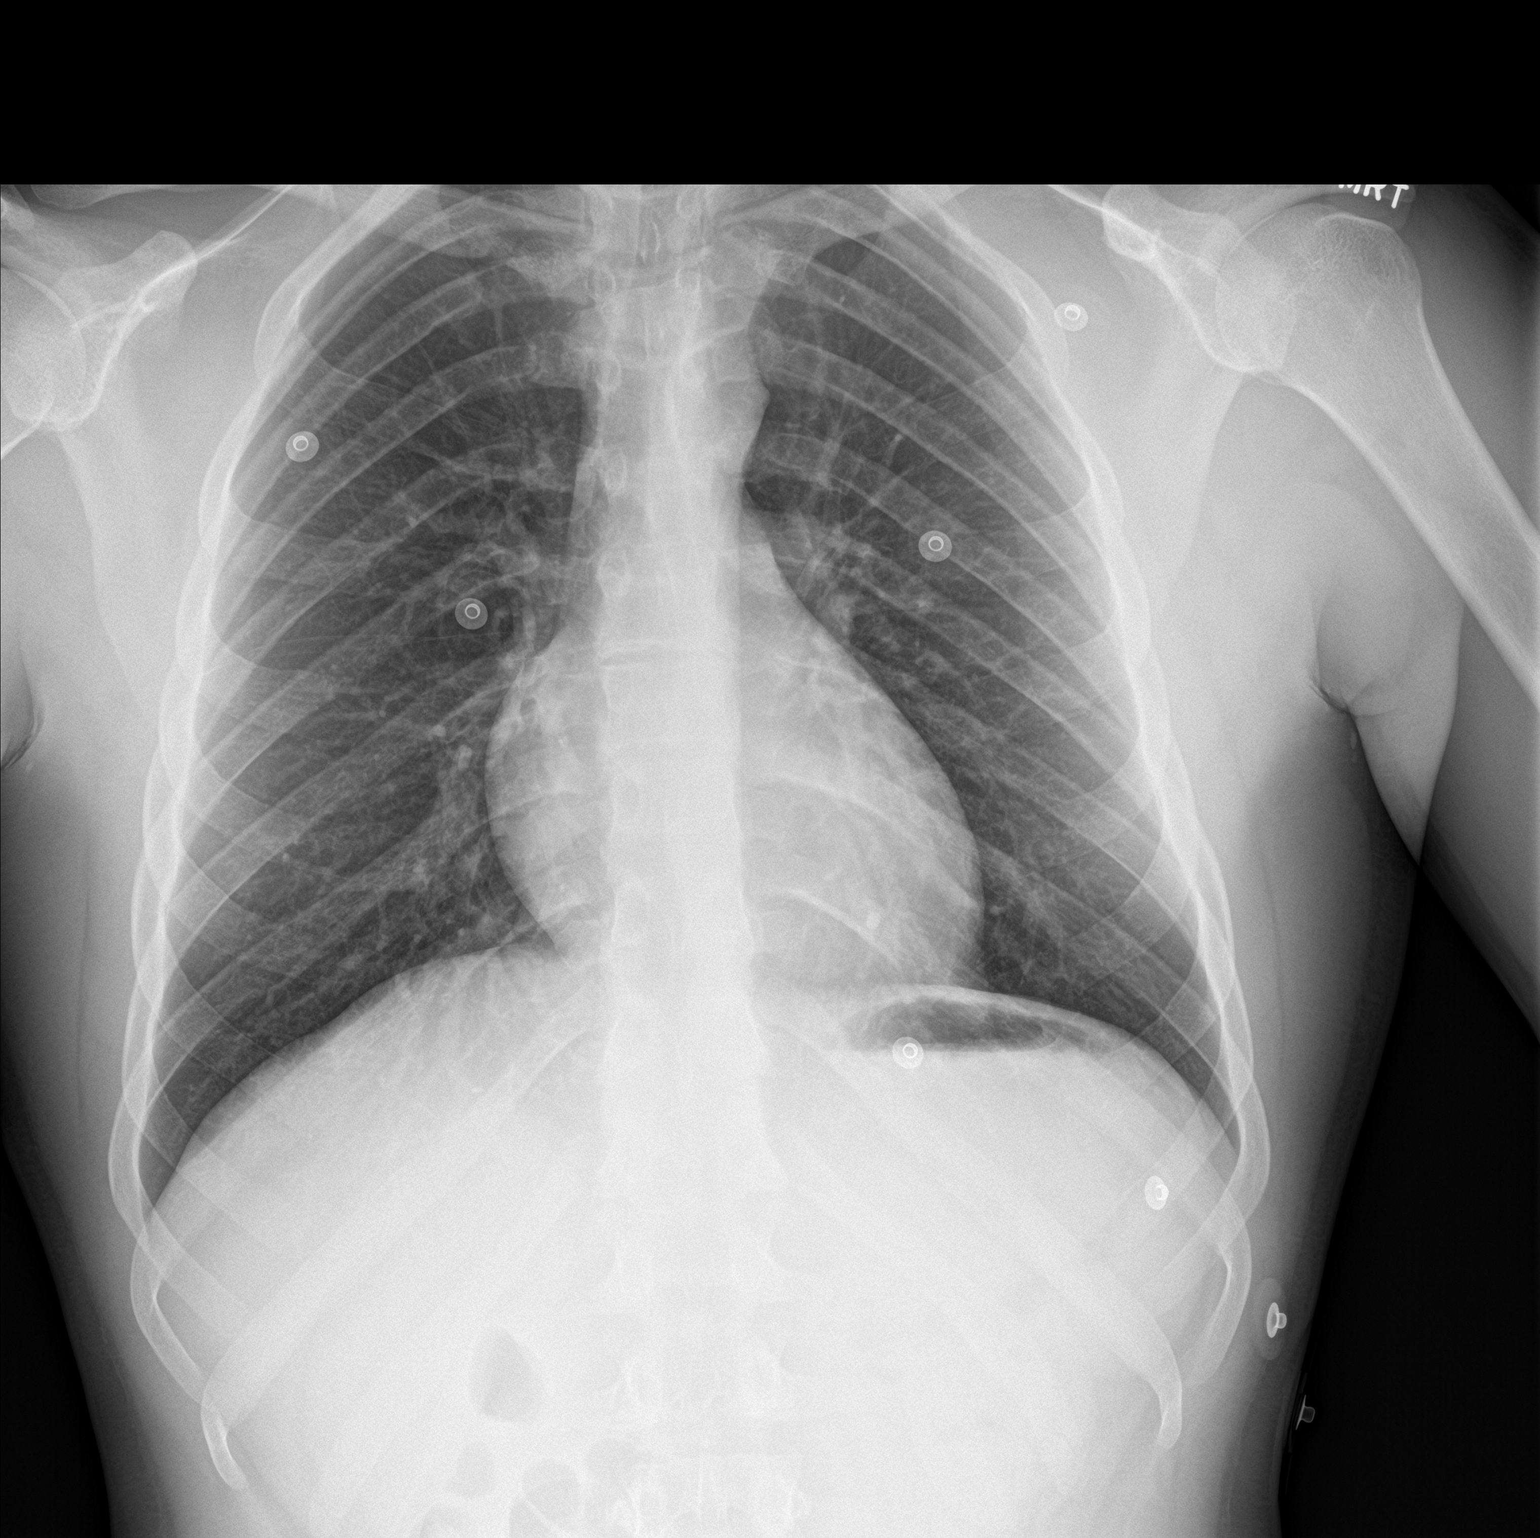

[chest lat]
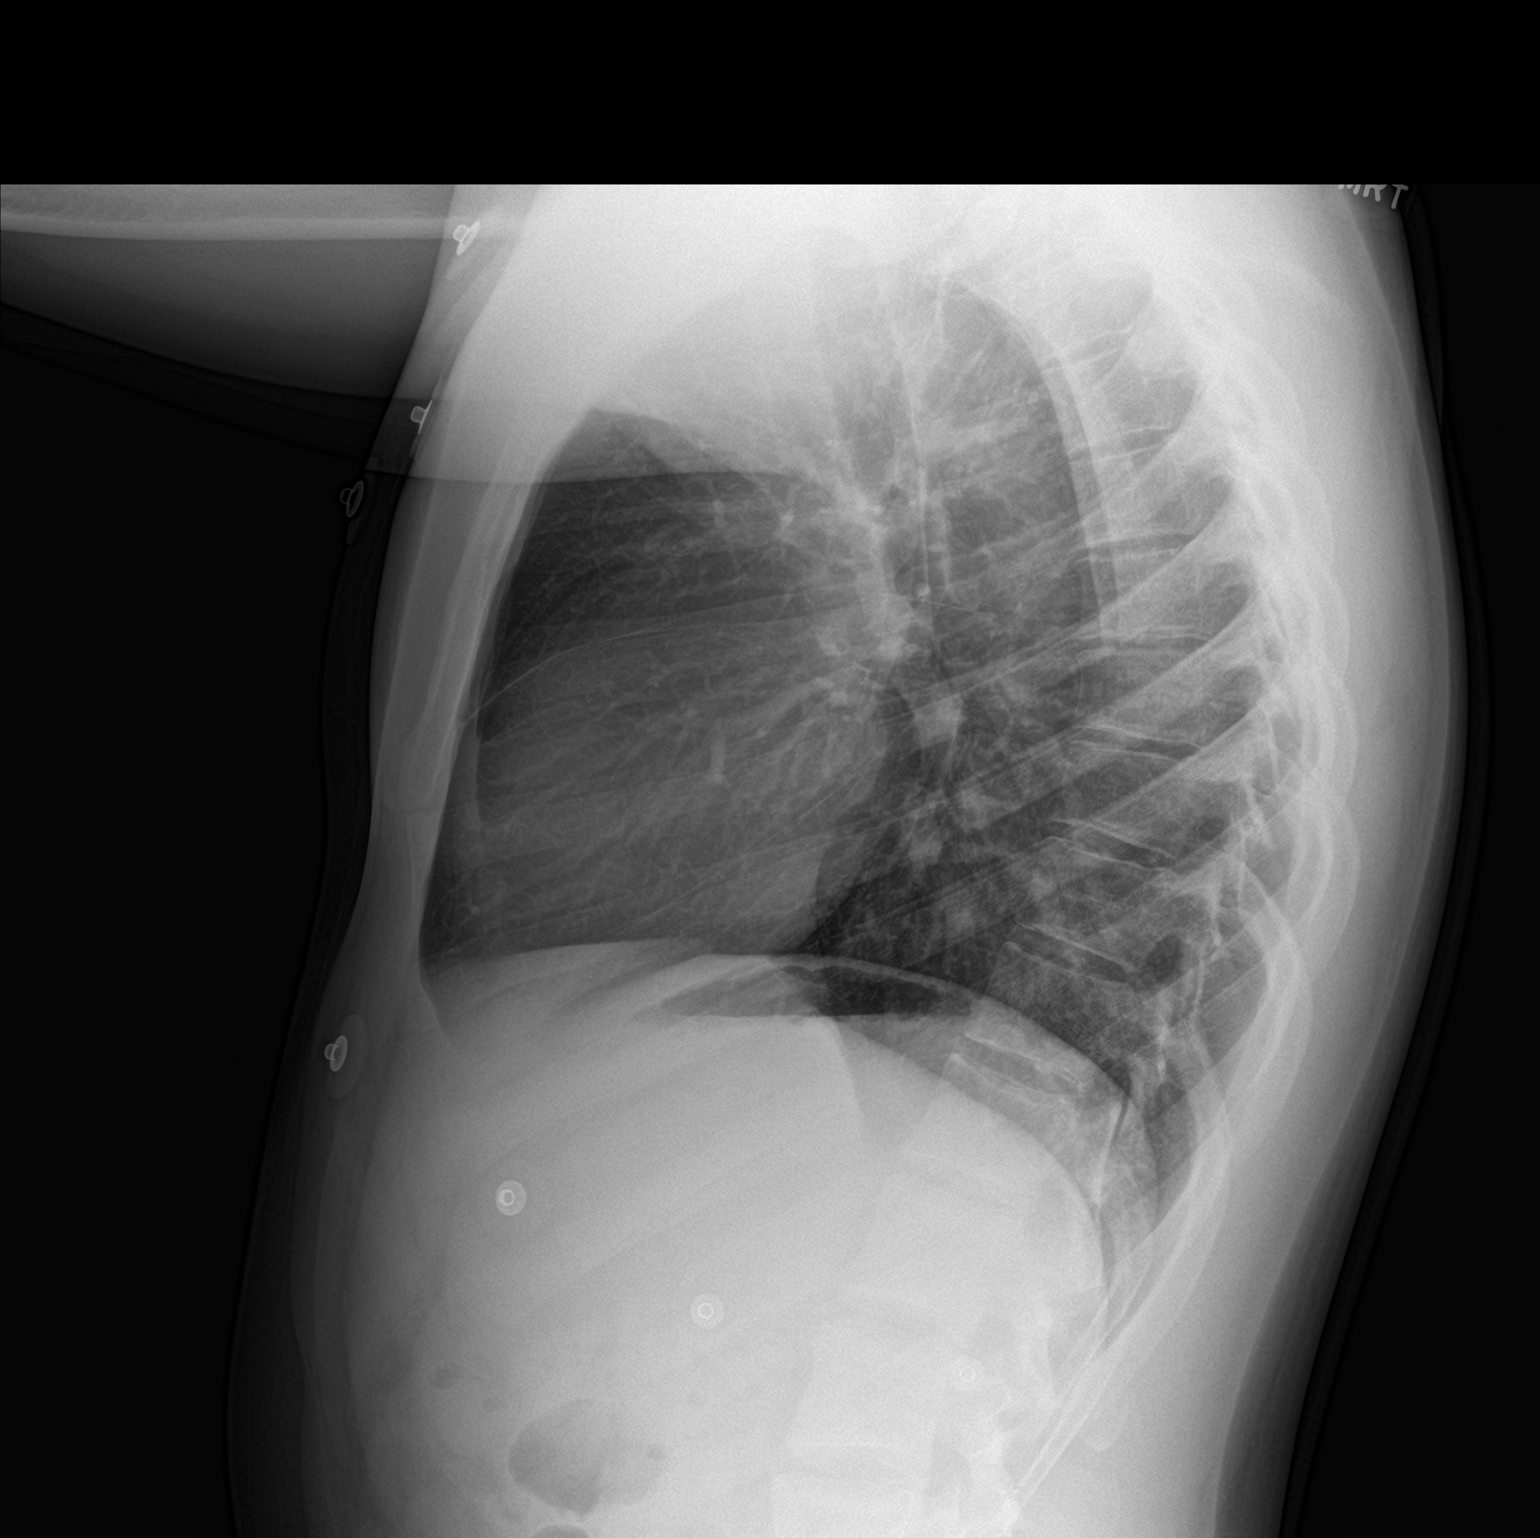

[2 of 2 positions shown; findings below may reference images not displayed]

FINDINGS: Normal heart size and mediastinal contours. No acute infiltrate or
edema. No effusion or pneumothorax. No acute osseous findings.
IMPRESSION: Negative chest.

## 2022-11-10 ENCOUNTER — Emergency Department (HOSPITAL_COMMUNITY)
Admission: EM | Admit: 2022-11-10 | Discharge: 2022-11-10 | Disposition: A | Discharge status: Home / Self Care | Payer: Federal, State, Local not specified - PPO | Attending: Emergency Medicine | Admitting: Emergency Medicine

## 2022-11-10 DIAGNOSIS — J029 Acute pharyngitis, unspecified: Secondary | ICD-10-CM

## 2022-11-10 DIAGNOSIS — Z1152 Encounter for screening for COVID-19: Secondary | ICD-10-CM | POA: Insufficient documentation

## 2022-11-10 DIAGNOSIS — J101 Influenza due to other identified influenza virus with other respiratory manifestations: Secondary | ICD-10-CM | POA: Insufficient documentation

## 2022-11-10 DIAGNOSIS — J069 Acute upper respiratory infection, unspecified: Secondary | ICD-10-CM

## 2022-11-10 LAB — RESP PANEL BY RT-PCR (RSV, FLU A&B, COVID)  RVPGX2
Influenza A by PCR: NEGATIVE
Influenza B by PCR: POSITIVE — AB
Resp Syncytial Virus by PCR: NEGATIVE
SARS Coronavirus 2 by RT PCR: NEGATIVE

## 2022-11-10 LAB — GROUP A STREP BY PCR: Group A Strep by PCR: NOT DETECTED

## 2022-11-10 MED ORDER — FLUTICASONE PROPIONATE 50 MCG/ACT NA SUSP: 2.0000 | Freq: Every day | NASAL | 0 refills | Status: AC

## 2022-11-10 NOTE — ED Triage Notes (Signed)
Pt to ED c/o sore throat, congestion, cough , body chills x 10 days.

## 2022-11-10 NOTE — ED Provider Notes (Signed)
[facility] EMERGENCY DEPARTMENT Provider Note   CSN: [id number] Arrival date & time: 11/10/22  1914     History  Chief Complaint  Patient presents with   Sore Throat    Kelly Patrick is a 26 y.o. male.   Sore Throat  Patient with approximately 10 days of congestion sore throat fatigue body aches chills.  He states he has occasionally cough but is not producing much sputum he has not had any fevers.  He states he is generally had some improvement of the past few days but with his persistent symptoms he went to be checked out.  Denies any nausea vomiting diarrhea no lightheadedness or dizziness.     Home Medications Prior to Admission medications   Medication Sig Start Date End Date Taking? Authorizing Provider  fluticasone (FLONASE) 50 MCG/ACT nasal spray Place 2 sprays into both nostrils daily for 14 days. 11/10/22 11/24/22 Yes Sayge Brienza S, PA  albuterol (VENTOLIN HFA) 108 (90 Base) MCG/ACT inhaler 2 puffs every 4 hours as needed for cough, wheeze, tightness in chest, or shortness of breath. 11/28/21   Hetty Blend, FNP  EPINEPHrine (EPIPEN 2-PAK) 0.3 mg/0.3 mL IJ SOAJ injection Inject 0.3 mg into the muscle as needed for anaphylaxis. 12/12/21   Hetty Blend, FNP  famotidine (PEPCID) 20 MG tablet Take 1 tablet (20 mg total) by mouth 2 (two) times daily. 03/12/22   Renne Crigler, PA-C  fluticasone (FLOVENT HFA) 110 MCG/ACT inhaler 2 puffs twice a day with a spacer for 2 weeks or until cough and wheeze free 11/28/21   Ambs, Norvel Richards, FNP  gabapentin (NEURONTIN) 100 MG capsule Take 200 mg by mouth 2 (two) times daily. 08/01/19   [provider]  ondansetron (ZOFRAN-ODT) 4 MG disintegrating tablet Take 1 tablet (4 mg total) by mouth every 8 (eight) hours as needed for nausea or vomiting. 02/24/22   Gailen Shelter, PA      Allergies    Sudafed  [pseudoephedrine hcl], Cetirizine, Pseudoephedrine, and Diphenhydramine    Review of Systems   Review of  Systems  Physical Exam Updated Vital Signs BP (!) 142/93 (BP Location: Right Arm)   Pulse 100   Temp 99 F (37.2 C)   Resp 18   Ht 6' (1.829 m)   Wt 81.6 kg   SpO2 95%   BMI 24.41 kg/m  Physical Exam Vitals and nursing note reviewed.  Constitutional:      General: He is not in acute distress.    Comments: Pleasant well-appearing 26 year old.  In no acute distress. Able answer questions appropriately follow commands. No increased work of breathing. Speaking in full sentences.   HENT:     Head: Normocephalic and atraumatic.     Nose: Nose normal.     Mouth/Throat:     Comments: Moist oral mucosa, posterior oropharynx with some cobblestoning Eyes:     General: No scleral icterus. Cardiovascular:     Rate and Rhythm: Normal rate and regular rhythm.     Pulses: Normal pulses.     Heart sounds: Normal heart sounds.  Pulmonary:     Effort: Pulmonary effort is normal. No respiratory distress.     Breath sounds: No wheezing.  Abdominal:     Palpations: Abdomen is soft.     Tenderness: There is no abdominal tenderness.  Musculoskeletal:     Cervical back: Normal range of motion.     Right lower leg: No edema.     Left lower leg:  No edema.  Skin:    General: Skin is warm and dry.     Capillary Refill: Capillary refill takes less than 2 seconds.  Neurological:     Mental Status: He is alert. Mental status is at baseline.  Psychiatric:        Mood and Affect: Mood normal.        Behavior: Behavior normal.     ED Results / Procedures / Treatments   Labs (all labs ordered are listed, but only abnormal results are displayed) Labs Reviewed  RESP PANEL BY RT-PCR (RSV, FLU A&B, COVID)  [id number] - Abnormal; Notable for the following components:      Result Value   Influenza B by PCR POSITIVE (*)    All other components within normal limits  GROUP A STREP BY PCR    EKG None  Radiology No results found.  Procedures Procedures    Medications Ordered in ED Medications -  No data to display  ED Course/ Medical Decision Making/ A&P                           Medical Decision Making   Patient with approximately 10 days of congestion sore throat fatigue body aches chills.  He states he has occasionally cough but is not producing much sputum he has not had any fevers.  He states he is generally had some improvement of the past few days but with his persistent symptoms he went to be checked out.  Denies any nausea vomiting diarrhea no lightheadedness or dizziness.   Physical exam unremarkable patient is well-appearing on exam.  Vital signs normal no tachycardia on my evaluation heart rate is 80s  Influenza B positive, strep negative  Recommend hydration, Tylenol, Motrin and over-the-counter medication recommendations were given.  Will discharge home at this time.   Final Clinical Impression(s) / ED Diagnoses Final diagnoses:  Viral URI with cough  Viral pharyngitis    Rx / DC Orders ED Discharge Orders          Ordered    fluticasone (FLONASE) 50 MCG/ACT nasal spray  Daily        11/10/22 2143              Gailen Shelter, [place] 11/10/22 2143    Virgina Norfolk, DO 11/10/22 2145

## 2022-11-10 NOTE — Discharge Instructions (Signed)
You tested positive for influenza B.  This is a self-limited disease and will get better with time.  Your symptoms will be improved with Tylenol and Motrin as discussed below Please use Tylenol or ibuprofen for pain.  You may use 600 mg ibuprofen every 6 hours or 1000 mg of Tylenol every 6 hours.  You may choose to alternate between the 2.  This would be most effective.  Not to exceed 4 g of Tylenol within 24 hours.  Not to exceed 3200 mg ibuprofen 24 hours.    Viral Illness TREATMENT  Treatment is directed at relieving symptoms. There is no cure. Antibiotics are not effective, because the infection is caused by a virus, not by bacteria. Treatment may include:  Increased fluid intake. Sports drinks offer valuable electrolytes, sugars, and fluids.  Breathing heated mist or steam (vaporizer or shower).  Eating chicken soup or other clear broths, and maintaining good nutrition.  Getting plenty of rest.  Using gargles or lozenges for comfort.  Increasing usage of your inhaler if you have asthma.  Return to work when your temperature has returned to normal.  Gargle warm salt water and spit it out for sore throat. Take benadryl to decrease sinus secretions. Continue to alternate between Tylenol and ibuprofen for pain and fever control.  Follow Up: Follow up with your primary care doctor in 5-7 days for recheck of ongoing symptoms.  Return to emergency department for emergent changing or worsening of symptoms.

## 2022-12-26 NOTE — Progress Notes (Signed)
error
